# Patient Record
Sex: Female | Born: 1975 | Race: Black or African American | Hispanic: No | Marital: Single | Smoking: Current some day smoker
Health system: Southern US, Community
[De-identification: ages and names within clinical notes are randomized; demographics above are authoritative.]

## PROBLEM LIST (undated history)

## (undated) DIAGNOSIS — R51 Headache: Secondary | ICD-10-CM

## (undated) DIAGNOSIS — F4310 Post-traumatic stress disorder, unspecified: Secondary | ICD-10-CM

## (undated) DIAGNOSIS — E559 Vitamin D deficiency, unspecified: Secondary | ICD-10-CM

## (undated) DIAGNOSIS — R519 Headache, unspecified: Secondary | ICD-10-CM

## (undated) DIAGNOSIS — I10 Essential (primary) hypertension: Secondary | ICD-10-CM

## (undated) HISTORY — DX: Headache, unspecified: R51.9

## (undated) HISTORY — DX: Headache: R51

## (undated) HISTORY — PX: ANKLE SURGERY: SHX546

## (undated) HISTORY — PX: MULTIPLE TOOTH EXTRACTIONS: SHX2053

## (undated) HISTORY — DX: Post-traumatic stress disorder, unspecified: F43.10

## (undated) HISTORY — DX: Vitamin D deficiency, unspecified: E55.9

---

## 2007-04-13 ENCOUNTER — Emergency Department (HOSPITAL_COMMUNITY): Admission: EM | Admit: 2007-04-13 | Discharge: 2007-04-13 | Payer: Self-pay | Admitting: Emergency Medicine

## 2007-05-06 ENCOUNTER — Ambulatory Visit (HOSPITAL_COMMUNITY): Admission: RE | Admit: 2007-05-06 | Discharge: 2007-05-06 | Payer: Self-pay | Admitting: Internal Medicine

## 2008-05-26 ENCOUNTER — Emergency Department (HOSPITAL_COMMUNITY): Admission: EM | Admit: 2008-05-26 | Discharge: 2008-05-26 | Payer: Self-pay | Admitting: Emergency Medicine

## 2010-06-05 LAB — POCT URINALYSIS DIP (DEVICE)
Bilirubin Urine: NEGATIVE
Glucose, UA: NEGATIVE mg/dL
Nitrite: NEGATIVE

## 2010-06-05 LAB — URINE CULTURE: Colony Count: >=100000

## 2015-05-03 DIAGNOSIS — H539 Unspecified visual disturbance: Secondary | ICD-10-CM | POA: Insufficient documentation

## 2015-05-03 DIAGNOSIS — G43909 Migraine, unspecified, not intractable, without status migrainosus: Secondary | ICD-10-CM | POA: Insufficient documentation

## 2015-05-03 DIAGNOSIS — G5621 Lesion of ulnar nerve, right upper limb: Secondary | ICD-10-CM | POA: Insufficient documentation

## 2015-05-03 DIAGNOSIS — R739 Hyperglycemia, unspecified: Secondary | ICD-10-CM | POA: Insufficient documentation

## 2015-07-19 DIAGNOSIS — F324 Major depressive disorder, single episode, in partial remission: Secondary | ICD-10-CM | POA: Insufficient documentation

## 2015-07-19 DIAGNOSIS — F331 Major depressive disorder, recurrent, moderate: Secondary | ICD-10-CM | POA: Insufficient documentation

## 2015-10-19 DIAGNOSIS — R109 Unspecified abdominal pain: Secondary | ICD-10-CM | POA: Insufficient documentation

## 2016-03-27 DIAGNOSIS — E669 Obesity, unspecified: Secondary | ICD-10-CM | POA: Insufficient documentation

## 2018-03-12 ENCOUNTER — Emergency Department (HOSPITAL_COMMUNITY): Payer: No Typology Code available for payment source

## 2018-03-12 ENCOUNTER — Encounter (HOSPITAL_COMMUNITY): Payer: Self-pay

## 2018-03-12 ENCOUNTER — Emergency Department (HOSPITAL_COMMUNITY)
Admission: EM | Admit: 2018-03-12 | Discharge: 2018-03-12 | Disposition: A | Discharge status: Home / Self Care | Payer: No Typology Code available for payment source | Attending: Emergency Medicine | Admitting: Emergency Medicine

## 2018-03-12 DIAGNOSIS — S199XXA Unspecified injury of neck, initial encounter: Secondary | ICD-10-CM | POA: Diagnosis present

## 2018-03-12 DIAGNOSIS — Y92410 Unspecified street and highway as the place of occurrence of the external cause: Secondary | ICD-10-CM | POA: Diagnosis not present

## 2018-03-12 DIAGNOSIS — Y9389 Activity, other specified: Secondary | ICD-10-CM | POA: Insufficient documentation

## 2018-03-12 DIAGNOSIS — S161XXA Strain of muscle, fascia and tendon at neck level, initial encounter: Secondary | ICD-10-CM

## 2018-03-12 DIAGNOSIS — R51 Headache: Secondary | ICD-10-CM | POA: Insufficient documentation

## 2018-03-12 DIAGNOSIS — I10 Essential (primary) hypertension: Secondary | ICD-10-CM | POA: Diagnosis not present

## 2018-03-12 DIAGNOSIS — Y999 Unspecified external cause status: Secondary | ICD-10-CM | POA: Diagnosis not present

## 2018-03-12 HISTORY — DX: Essential (primary) hypertension: I10

## 2018-03-12 MED ORDER — ACETAMINOPHEN 325 MG PO TABS
650.0000 mg | ORAL_TABLET | Freq: Once | ORAL | Status: AC
  Administered 2018-03-12: 650 mg via ORAL
  Filled 2018-03-12: qty 2

## 2018-03-12 MED ORDER — METHOCARBAMOL 500 MG PO TABS: 500.0000 mg | ORAL_TABLET | Freq: Two times a day (BID) | ORAL | 0 refills | Status: DC

## 2018-03-12 MED ORDER — ACETAMINOPHEN 325 MG PO TABS: 650.0000 mg | ORAL_TABLET | Freq: Four times a day (QID) | ORAL | 0 refills | Status: DC | PRN
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
## 2018-03-24 ENCOUNTER — Ambulatory Visit (INDEPENDENT_AMBULATORY_CARE_PROVIDER_SITE_OTHER): Payer: Self-pay | Admitting: Family Medicine

## 2018-03-24 ENCOUNTER — Encounter: Payer: Self-pay | Admitting: Family Medicine

## 2018-03-24 DIAGNOSIS — R319 Hematuria, unspecified: Secondary | ICD-10-CM

## 2018-03-24 DIAGNOSIS — Z0000 Encounter for general adult medical examination without abnormal findings: Secondary | ICD-10-CM

## 2018-03-24 DIAGNOSIS — R8290 Unspecified abnormal findings in urine: Secondary | ICD-10-CM

## 2018-03-24 DIAGNOSIS — N390 Urinary tract infection, site not specified: Secondary | ICD-10-CM | POA: Insufficient documentation

## 2018-03-24 DIAGNOSIS — Z09 Encounter for follow-up examination after completed treatment for conditions other than malignant neoplasm: Secondary | ICD-10-CM

## 2018-03-24 DIAGNOSIS — Z131 Encounter for screening for diabetes mellitus: Secondary | ICD-10-CM

## 2018-03-24 DIAGNOSIS — F419 Anxiety disorder, unspecified: Secondary | ICD-10-CM

## 2018-03-24 DIAGNOSIS — F4310 Post-traumatic stress disorder, unspecified: Secondary | ICD-10-CM

## 2018-03-24 DIAGNOSIS — I10 Essential (primary) hypertension: Secondary | ICD-10-CM | POA: Insufficient documentation

## 2018-03-24 LAB — POCT URINALYSIS DIP (MANUAL ENTRY)
Bilirubin, UA: NEGATIVE
Glucose, UA: NEGATIVE mg/dL
Ketones, POC UA: NEGATIVE mg/dL
Nitrite, UA: NEGATIVE
Spec Grav, UA: 1.025 (ref 1.010–1.025)
Urobilinogen, UA: 1 E.U./dL
pH, UA: 6 (ref 5.0–8.0)

## 2018-03-24 LAB — POCT GLYCOSYLATED HEMOGLOBIN (HGB A1C): Hemoglobin A1C: 5.4 % (ref 4.0–5.6)

## 2018-03-24 MED ORDER — SULFAMETHOXAZOLE-TRIMETHOPRIM 800-160 MG PO TABS: 1.0000 | ORAL_TABLET | Freq: Two times a day (BID) | ORAL | 0 refills | Status: DC

## 2018-03-24 NOTE — Patient Instructions (Addendum)
Sulfamethoxazole; Trimethoprim, SMX-TMP tablets What is this medicine? SULFAMETHOXAZOLE; TRIMETHOPRIM or SMX-TMP (suhl fuh meth OK suh zohl; trye METH oh prim) is a combination of a sulfonamide antibiotic and a second antibiotic, trimethoprim. It is used to treat or prevent certain kinds of bacterial infections. It will not work for colds, flu, or other viral infections. This medicine may be used for other purposes; ask your health care provider or pharmacist if you have questions. COMMON BRAND NAME(S): Bacter-Aid DS, Bactrim, Bactrim DS, Septra, Septra DS What should I tell my health care provider before I take this medicine? They need to know if you have any of these conditions: -anemia -asthma -being treated with anticonvulsants -if you frequently drink alcohol containing drinks -kidney disease -liver disease -low level of folic acid or glucose-6-phosphate dehydrogenase -poor nutrition or malabsorption -porphyria -severe allergies -thyroid disorder -an unusual or allergic reaction to sulfamethoxazole, trimethoprim, sulfa drugs, other medicines, foods, dyes, or preservatives -pregnant or trying to get pregnant -breast-feeding How should I use this medicine? Take this medicine by mouth with a full glass of water. Follow the directions on the prescription label. Take your medicine at regular intervals. Do not take it more often than directed. Do not skip doses or stop your medicine early. Talk to your pediatrician regarding the use of this medicine in children. Special care may be needed. This medicine has been used in children as young as 2 months of age. Overdosage: If you think you have taken too much of this medicine contact a poison control center or emergency room at once. NOTE: This medicine is only for you. Do not share this medicine with others. What if I miss a dose? If you miss a dose, take it as soon as you can. If it is almost time for your next dose, take only that dose. Do  not take double or extra doses. What may interact with this medicine? Do not take this medicine with any of the following medications: -aminobenzoate potassium -dofetilide -metronidazole This medicine may also interact with the following medications: -ACE inhibitors like benazepril, enalapril, lisinopril, and ramipril -birth control pills -cyclosporine -digoxin -diuretics -indomethacin -medicines for diabetes -methenamine -methotrexate -phenytoin -potassium supplements -pyrimethamine -sulfinpyrazone -tricyclic antidepressants -warfarin This list may not describe all possible interactions. Give your health care provider a list of all the medicines, herbs, non-prescription drugs, or dietary supplements you use. Also tell them if you smoke, drink alcohol, or use illegal drugs. Some items may interact with your medicine. What should I watch for while using this medicine? Tell your doctor or health care professional if your symptoms do not improve. Drink several glasses of water a day to reduce the risk of kidney problems. Do not treat diarrhea with over the counter products. Contact your doctor if you have diarrhea that lasts more than 2 days or if it is severe and watery. This medicine can make you more sensitive to the sun. Keep out of the sun. If you cannot avoid being in the sun, wear protective clothing and use a sunscreen. Do not use sun lamps or tanning beds/booths. What side effects may I notice from receiving this medicine? Side effects that you should report to your doctor or health care professional as soon as possible: -allergic reactions like skin rash or hives, swelling of the face, lips, or tongue -breathing problems -fever or chills, sore throat -irregular heartbeat, chest pain -joint or muscle pain -pain or difficulty passing urine -red pinpoint spots on skin -redness, blistering, peeling or loosening of   the skin, including inside the mouth -unusual bleeding or  bruising -unusually weak or tired -yellowing of the eyes or skin Side effects that usually do not require medical attention (report to your doctor or health care professional if they continue or are bothersome): -diarrhea -dizziness -headache -loss of appetite -nausea, vomiting -nervousness This list may not describe all possible side effects. Call your doctor for medical advice about side effects. You may report side effects to FDA at 1-800-FDA-1088. Where should I keep my medicine? Keep out of the reach of children. Store at room temperature between 20 to 25 degrees C (68 to 77 degrees F). Protect from light. Throw away any unused medicine after the expiration date. NOTE: This sheet is a summary. It may not cover all possible information. If you have questions about this medicine, talk to your doctor, pharmacist, or health care provider.  2019 Elsevier/Gold Standard (2012-09-17 14:38:26)       Urinary Tract Infection, Adult A urinary tract infection (UTI) is an infection of any part of the urinary tract. The urinary tract includes:  The kidneys.  The ureters.  The bladder.  The urethra. These organs make, store, and get rid of pee (urine) in the body. What are the causes? This is caused by germs (bacteria) in your genital area. These germs grow and cause swelling (inflammation) of your urinary tract. What increases the risk? You are more likely to develop this condition if:  You have a small, thin tube (catheter) to drain pee.  You cannot control when you pee or poop (incontinence).  You are female, and: ? You use these methods to prevent pregnancy: ? A medicine that kills sperm (spermicide). ? A device that blocks sperm (diaphragm). ? You have low levels of a female hormone (estrogen). ? You are pregnant.  You have genes that add to your risk.  You are sexually active.  You take antibiotic medicines.  You have trouble peeing because of: ? A prostate that is  bigger than normal, if you are female. ? A blockage in the part of your body that drains pee from the bladder (urethra). ? A kidney stone. ? A nerve condition that affects your bladder (neurogenic bladder). ? Not getting enough to drink. ? Not peeing often enough.  You have other conditions, such as: ? Diabetes. ? A weak disease-fighting system (immune system). ? Sickle cell disease. ? Gout. ? Injury of the spine. What are the signs or symptoms? Symptoms of this condition include:  Needing to pee right away (urgently).  Peeing often.  Peeing small amounts often.  Pain or burning when peeing.  Blood in the pee.  Pee that smells bad or not like normal.  Trouble peeing.  Pee that is cloudy.  Fluid coming from the vagina, if you are female.  Pain in the belly or lower back. Other symptoms include:  Throwing up (vomiting).  No urge to eat.  Feeling mixed up (confused).  Being tired and grouchy (irritable).  A fever.  Watery poop (diarrhea). How is this treated? This condition may be treated with:  Antibiotic medicine.  Other medicines.  Drinking enough water. Follow these instructions at home:  Medicines  Take over-the-counter and prescription medicines only as told by your doctor.  If you were prescribed an antibiotic medicine, take it as told by your doctor. Do not stop taking it even if you start to feel better. General instructions  Make sure you: ? Pee until your bladder is empty. ? Do not  hold pee for a long time. ? Empty your bladder after sex. ? Wipe from front to back after pooping if you are a female. Use each tissue one time when you wipe.  Drink enough fluid to keep your pee pale yellow.  Keep all follow-up visits as told by your doctor. This is important. Contact a doctor if:  You do not get better after 1-2 days.  Your symptoms go away and then come back. Get help right away if:  You have very bad back pain.  You have very bad  pain in your lower belly.  You have a fever.  You are sick to your stomach (nauseous).  You are throwing up. Summary  A urinary tract infection (UTI) is an infection of any part of the urinary tract.  This condition is caused by germs in your genital area.  There are many risk factors for a UTI. These include having a small, thin tube to drain pee and not being able to control when you pee or poop.  Treatment includes antibiotic medicines for germs.  Drink enough fluid to keep your pee pale yellow. This information is not intended to replace advice given to you by your health care provider. Make sure you discuss any questions you have with your health care provider. Document Released: 07/30/2007 Document Revised: 08/20/2017 Document Reviewed: 08/20/2017 Elsevier Interactive Patient Education  2019 [employer] Inc.  Muscle Strain A muscle strain is an injury that happens when a muscle is stretched longer than normal. This can happen during a fall, sports, or lifting. This can tear some muscle fibers. Usually, recovery from muscle strain takes 1-2 weeks. Complete healing normally takes 5-6 weeks. This condition is first treated with PRICE therapy. This involves:  Protecting your muscle from being injured again.  Resting your injured muscle.  Icing your injured muscle.  Applying pressure (compression) to your injured muscle. This may be done with a splint or elastic bandage.  Raising (elevating) your injured muscle. Your doctor may also recommend medicine for pain. Follow these instructions at home: If you have a splint:  Wear the splint as told by your doctor. Take it off only as told by your doctor.  Loosen the splint if your fingers or toes tingle, get numb, or turn cold and blue.  Keep the splint clean.  If the splint is not waterproof: ? Do not let it get wet. ? Cover it with a watertight covering when you take a bath or a shower. Managing pain, stiffness, and  swelling   If directed, put ice on your injured area. ? If you have a removable splint, take it off as told by your doctor. ? Put ice in a plastic bag. ? Place a towel between your skin and the bag. ? Leave the ice on for 20 minutes, 2-3 times a day.  Move your fingers or toes often. This helps to avoid stiffness and lessen swelling.  Raise your injured area above the level of your heart while you are sitting or lying down.  Wear an elastic bandage as told by your doctor. Make sure it is not too tight. General instructions  Take over-the-counter and prescription medicines only as told by your doctor.  Limit your activity. Rest your injured muscle as told by your doctor. Your doctor may say that gentle movements are okay.  If physical therapy was prescribed, do exercises as told by your doctor.  Do not put pressure on any part of the splint until it is  fully hardened. This may take many hours.  Do not use any products that contain nicotine or tobacco, such as cigarettes and e-cigarettes. These can delay bone healing. If you need help quitting, ask your doctor.  Warm up before you exercise. This helps to prevent more muscle strains.  Ask your doctor when it is safe to drive if you have a splint.  Keep all follow-up visits as told by your doctor. This is important. Contact a doctor if:  You have more pain or swelling in your injured area. Get help right away if:  You have any of these problems in your injured area: ? You have numbness. ? You have tingling. ? You lose a lot of strength. Summary  A muscle strain is an injury that happens when a muscle is stretched longer than normal.  This condition is first treated with PRICE therapy. This includes protecting, resting, icing, adding pressure, and raising your injury.  Limit your activity. Rest your injured muscle as told by your doctor. Your doctor may say that gentle movements are okay.  Warm up before you exercise. This  helps to prevent more muscle strains. This information is not intended to replace advice given to you by your health care provider. Make sure you discuss any questions you have with your health care provider. Document Released: 11/20/2007 Document Revised: 03/19/2016 Document Reviewed: 03/19/2016 Elsevier Interactive Patient Education  2019 Elsevier Inc.     Living With Post-Traumatic Stress Disorder If you have been diagnosed with post-traumatic stress disorder (PTSD), you may be relieved that you now know why you have felt or behaved a certain way. Still, you may feel overwhelmed about the treatment ahead. You may also wonder how to get the support you need and how to deal with the condition day-to-day. If you are living with PTSD, there are ways to help you recover from it and manage your symptoms. How to manage lifestyle changes Managing stress Stress is your body's reaction to life changes and events, both good and bad. Stress can make PTSD worse. Take the following steps to cope with stress:  Talk with your health care provider or a counselor if you would like to learn more about techniques to reduce your stress. He or she may suggest some stress reduction techniques such as: ? Muscle relaxation exercises. ? Regular exercise. ? Meditation, yoga, or other mind-body exercises. ? Breathing exercises. ? Listening to quiet music. ? Spending time outside.  Maintain a healthy lifestyle. Eat a healthy diet, exercise regularly, get plenty of sleep, and take time to relax.  Spend time with others. Talk with them about how you are feeling and what kind of support you need. Try to not isolate yourself, even though you may feel like doing that. Isolating yourself can delay your recovery.  Do activities and hobbies that you enjoy.  Pace yourself when doing stressful things. Take breaks, and reward yourself when you finish. Make sure that you do not overload your schedule.  Medicines Your  health care provider may suggest certain medicines if he or she feels that they will help to improve your condition. Antidepressants or antipsychotic medicines may be used to treat PTSD. Avoid using alcohol and other substances that may prevent your medicines from working properly (may interact). It is also important to:  Talk with your pharmacist or health care provider about all medicines that you take, their possible side effects, and which medicines are safe to take together.  Make it your goal to take  part in all treatment decisions (shared decision-making). Ask about possible side effects of medicines that your health care provider recommends, and tell him or her how you feel about having those side effects. It is best if shared decision-making with your health care provider is part of your total treatment plan. If your health care provider prescribes a medicine, you may not notice the full benefits of it for 4-8 weeks. Most people who are treated for PTSD need to take medicine for at least 6-12 months after they feel better. If you are taking medicines as part of your treatment, do not stop taking medicines before you ask your health care provider if it is safe to stop. You may need to have the medicine slowly decreased (tapered) over time to lower the risk of harmful side effects. Relationships Many people who have PTSD have difficulty trusting others. Make an effort to:  Take risks and develop trust with close friends and family members. Developing trust in others can help you feel safe and connect you with emotional support.  Be open and honest about your feelings.  Try to have fun and relax in safe spaces, such as with friends and family.  Think about going to couples counseling, family education classes, or family therapy. Your loved ones may not always know how to be supportive. Therapy can be helpful for everyone. How to recognize changes in your condition Be aware of your symptoms and  how often you have them. The following symptoms mean that you need to seek help for your PTSD:  You feel suspicious and angry.  You have repeated flashbacks.  You avoid going out or being with others.  You have an increasing number of fights with close friends or family members, such as your spouse.  You have thoughts about hurting yourself or others.  You cannot get relief from feelings of depression or anxiety. Where to find support Talking to others  Explain that PTSD is a mental health problem. It is something that a person can develop after experiencing or seeing a life-threatening event. Tell them that PTSD makes you feel stress like you did during the event.  Talk to your loved ones about the symptoms you have. Also tell them what things or situations can cause symptoms to start (are triggers for you).  Assure your loved ones that there are treatments to help PTSD. Discuss possibly seeking family therapy or couples therapy.  If you are worried or fearful about seeking treatment, ask for support. Finances Not all insurance plans cover mental health care, so it is important to check with your insurance carrier. If paying for co-pays or counseling services is a problem, search for a local or county mental health care center. Public mental health care services may be offered there at a low cost or no cost when you are not able to see a private health care provider. If you are a veteran, contact a local veterans organization or veterans hospital for more information. If you are taking medicine for PTSD, you may be able to get the genericform, which may be less expensive than brand-name medicine. Some makers of prescription medicines also offer help to patients who cannot afford the medicines that they need. Community resources  Find a support group in your community. Often, groups are available for [employer] veterans, trauma victims, and family members or caregivers.  Look into  volunteer opportunities. Taking part in these can help you feel more connected to your community.  Contact a local  organization to find out if you are eligible for a service dog.  Keep daily contact with at least one trusted friend or family member. Follow these instructions at home: Lifestyle  Exercise regularly. Try to do 30 or more minutes of physical activity on most days of the week.  Try to get 7-9 hours of sleep each night. To help with sleep: ? Keep your bedroom cool and dark. ? Do not eat a heavy meal during the hour before you go to bed. ? Do not drink alcohol or caffeinated drinks before bed. ? Avoid screen time before bedtime. This means avoiding use of your TV, computer, tablet, and cell phone.  Avoid using alcohol or drugs.  Practice self-soothing skills and use them daily.  Try to have fun and seek humor in your life. General instructions  If your PTSD is affecting your marriage or family, seek help from a family therapist.  Take over-the-counter and prescription medicines only as told by your health care provider.  Make sure to let all of your health care providers know that you have PTSD. This is especially important if you are having surgery or need to be admitted to the hospital.  Keep all follow-up visits as told by your health care providers. This is important. Where to find more information Go to this website to find more information about PTSD, treatment for PTSD, and how to get support:  [facility]: www.ptsd.[employer].tnva.gov Contact a health care provider if:  Your symptoms get worse or they do not get better. Get help right away if:  You have thoughts about hurting yourself or others. If you ever feel like you may hurt yourself or others, or have thoughts about taking your own life, get help right away. You can go to your nearest emergency department or call:  Your local emergency services (911 in the [place]  A suicide crisis helpline, such as  the National Suicide Prevention Lifeline at 364-039-93411-832 196 7837. This is open 24-hours a day. Summary  If you are living with PTSD, there are ways to help you recover from it and manage your symptoms.  Find supportive environments and people who understand PTSD. Spend time in those places, and maintain contact with those people.  Work with your health care team to create a management plan that includes counseling, stress management techniques, and healthy lifestyle habits. This information is not intended to replace advice given to you by your health care provider. Make sure you discuss any questions you have with your health care provider. Document Released: 06/12/2016 Document Revised: 06/12/2016 Document Reviewed: 06/12/2016 Elsevier Interactive Patient Education  2019 [employer]
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
## 2018-03-25 ENCOUNTER — Other Ambulatory Visit: Payer: Self-pay | Admitting: Family Medicine

## 2018-03-25 DIAGNOSIS — E559 Vitamin D deficiency, unspecified: Secondary | ICD-10-CM

## 2018-03-25 MED ORDER — VITAMIN D (ERGOCALCIFEROL) 1.25 MG (50000 UNIT) PO CAPS: 50000.0000 [IU] | ORAL_CAPSULE | ORAL | 3 refills | Status: DC

## 2018-03-26 ENCOUNTER — Telehealth: Payer: Self-pay

## 2018-03-26 LAB — COMPREHENSIVE METABOLIC PANEL
ALT: 14 IU/L (ref 0–32)
AST: 15 IU/L (ref 0–40)
Albumin/Globulin Ratio: 1.8 (ref 1.2–2.2)
Albumin: 4.2 g/dL (ref 3.8–4.8)
Alkaline Phosphatase: 58 IU/L (ref 39–117)
BUN/Creatinine Ratio: 8 — ABNORMAL LOW (ref 9–23)
BUN: 9 mg/dL (ref 6–24)
Bilirubin Total: 0.4 mg/dL (ref 0.0–1.2)
CO2: 24 mmol/L (ref 20–29)
Calcium: 9.8 mg/dL (ref 8.7–10.2)
Chloride: 105 mmol/L (ref 96–106)
Creatinine, Ser: 1.07 mg/dL — ABNORMAL HIGH (ref 0.57–1.00)
GFR calc Af Amer: 73 mL/min/{1.73_m2} (ref >59)
GFR calc non Af Amer: 63 mL/min/{1.73_m2} (ref >59)
Globulin, Total: 2.4 g/dL (ref 1.5–4.5)
Glucose: 83 mg/dL (ref 65–99)
Potassium: 4.7 mmol/L (ref 3.5–5.2)
Sodium: 140 mmol/L (ref 134–144)
Total Protein: 6.6 g/dL (ref 6.0–8.5)

## 2018-03-26 LAB — LIPID PANEL
Chol/HDL Ratio: 2.1 ratio (ref 0.0–4.4)
Cholesterol, Total: 125 mg/dL (ref 100–199)
HDL: 60 mg/dL (ref >39)
LDL Calculated: 49 mg/dL (ref 0–99)
Triglycerides: 80 mg/dL (ref 0–149)
VLDL Cholesterol Cal: 16 mg/dL (ref 5–40)

## 2018-03-26 LAB — CBC WITH DIFFERENTIAL/PLATELET
Basophils Absolute: 0 10*3/uL (ref 0.0–0.2)
Basos: 1 %
EOS (ABSOLUTE): 0 10*3/uL (ref 0.0–0.4)
Eos: 1 %
Hematocrit: 41.5 % (ref 34.0–46.6)
Hemoglobin: 13.8 g/dL (ref 11.1–15.9)
Immature Grans (Abs): 0 10*3/uL (ref 0.0–0.1)
Immature Granulocytes: 0 %
Lymphocytes Absolute: 1.9 10*3/uL (ref 0.7–3.1)
Lymphs: 35 %
MCH: 29.4 pg (ref 26.6–33.0)
MCHC: 33.3 g/dL (ref 31.5–35.7)
MCV: 89 fL (ref 79–97)
Monocytes Absolute: 0.4 10*3/uL (ref 0.1–0.9)
Monocytes: 7 %
Neutrophils Absolute: 3.1 10*3/uL (ref 1.4–7.0)
Neutrophils: 56 %
Platelets: 298 10*3/uL (ref 150–450)
RBC: 4.69 x10E6/uL (ref 3.77–5.28)
RDW: 13.3 % (ref 11.7–15.4)
WBC: 5.5 10*3/uL (ref 3.4–10.8)

## 2018-03-26 LAB — TSH: TSH: 2.61 u[IU]/mL (ref 0.450–4.500)

## 2018-03-26 LAB — HEPB+HEPC+HIV PANEL
HIV Screen 4th Generation wRfx: NONREACTIVE
Hep B C IgM: NEGATIVE
Hep B Core Total Ab: NEGATIVE
Hep B E Ab: NEGATIVE
Hep B E Ag: NEGATIVE
Hep B Surface Ab, Qual: NONREACTIVE
Hep C Virus Ab: <0.1 s/co ratio (ref 0.0–0.9)
Hepatitis B Surface Ag: NEGATIVE

## 2018-03-26 LAB — URINE CULTURE

## 2018-03-26 LAB — VITAMIN D 25 HYDROXY (VIT D DEFICIENCY, FRACTURES): Vit D, 25-Hydroxy: 8.2 ng/mL — ABNORMAL LOW (ref 30.0–100.0)

## 2018-03-26 NOTE — Telephone Encounter (Signed)
-----   Message from Natalie M Stroud, FNP sent at 03/25/2018  9:04 PM EST ----- Regarding: "Vitamin D" Carrie,   Please inform patient that he has a low Vitamin D level. We recommend that he takes a weekly Vitamin D supplement of 50,000 IUs weekly to increase vitamin d levels. Rx sent to pharmacy today.    Hep results are still pending.   Thank you.           

## 2018-03-26 NOTE — Telephone Encounter (Signed)
Patient notified

## 2018-03-26 NOTE — Telephone Encounter (Signed)
Left a vm for patient to callback 

## 2018-03-26 NOTE — Telephone Encounter (Signed)
-----   Message from Kallie Locks, FNP sent at 03/25/2018  9:04 PM EST ----- Regarding: "Vitamin D" Melanie Shaw,   Please inform patient that he has a low Vitamin D level. We recommend that he takes a weekly Vitamin D supplement of 50,000 IUs weekly to increase vitamin d levels. Rx sent to pharmacy today.    Hep results are still pending.   Thank you.

## 2018-03-30 ENCOUNTER — Other Ambulatory Visit: Payer: Self-pay | Admitting: Family Medicine

## 2018-04-13 ENCOUNTER — Ambulatory Visit (HOSPITAL_COMMUNITY): Payer: Self-pay | Admitting: Psychiatry

## 2018-05-24 ENCOUNTER — Ambulatory Visit: Payer: Self-pay | Admitting: Family Medicine

## 2018-06-14 ENCOUNTER — Ambulatory Visit: Payer: Self-pay | Admitting: Family Medicine

## 2018-07-02 ENCOUNTER — Ambulatory Visit (INDEPENDENT_AMBULATORY_CARE_PROVIDER_SITE_OTHER): Payer: Commercial Managed Care - PPO | Admitting: Family Medicine

## 2018-07-02 ENCOUNTER — Other Ambulatory Visit: Payer: Self-pay

## 2018-07-02 ENCOUNTER — Encounter: Payer: Self-pay | Admitting: Family Medicine

## 2018-07-02 VITALS — BP 130/86 | HR 84 | Temp 98.0°F | Ht 60.0 in | Wt 172.8 lb

## 2018-07-02 DIAGNOSIS — Z1272 Encounter for screening for malignant neoplasm of vagina: Secondary | ICD-10-CM | POA: Diagnosis not present

## 2018-07-02 DIAGNOSIS — N390 Urinary tract infection, site not specified: Secondary | ICD-10-CM | POA: Diagnosis not present

## 2018-07-02 DIAGNOSIS — E559 Vitamin D deficiency, unspecified: Secondary | ICD-10-CM

## 2018-07-02 DIAGNOSIS — Z09 Encounter for follow-up examination after completed treatment for conditions other than malignant neoplasm: Secondary | ICD-10-CM

## 2018-07-02 DIAGNOSIS — R8290 Unspecified abnormal findings in urine: Secondary | ICD-10-CM | POA: Diagnosis not present

## 2018-07-02 LAB — POCT URINALYSIS DIP (MANUAL ENTRY)
Bilirubin, UA: NEGATIVE
Glucose, UA: NEGATIVE mg/dL
Ketones, POC UA: NEGATIVE mg/dL
Nitrite, UA: NEGATIVE
Protein Ur, POC: NEGATIVE mg/dL
Spec Grav, UA: 1.02 (ref 1.010–1.025)
Urobilinogen, UA: 0.2 E.U./dL
pH, UA: 7.5 (ref 5.0–8.0)

## 2018-07-02 MED ORDER — VITAMIN D (ERGOCALCIFEROL) 1.25 MG (50000 UNIT) PO CAPS: 50000.0000 [IU] | ORAL_CAPSULE | ORAL | 3 refills | Status: DC

## 2018-07-02 MED ORDER — SULFAMETHOXAZOLE-TRIMETHOPRIM 800-160 MG PO TABS: 1.0000 | ORAL_TABLET | Freq: Two times a day (BID) | ORAL | 0 refills | Status: DC

## 2018-07-02 NOTE — Patient Instructions (Signed)
Sulfamethoxazole; Trimethoprim, SMX-TMP tablets What is this medicine? SULFAMETHOXAZOLE; TRIMETHOPRIM or SMX-TMP (suhl fuh meth OK suh zohl; trye METH oh prim) is a combination of a sulfonamide antibiotic and a second antibiotic, trimethoprim. It is used to treat or prevent certain kinds of bacterial infections. It will not work for colds, flu, or other viral infections. This medicine may be used for other purposes; ask your health care provider or pharmacist if you have questions. COMMON BRAND NAME(S): Bacter-Aid DS, Bactrim, Bactrim DS, Septra, Septra DS What should I tell my health care provider before I take this medicine? They need to know if you have any of these conditions: -anemia -asthma -being treated with anticonvulsants -if you frequently drink alcohol containing drinks -kidney disease -liver disease -low level of folic acid or glucose-6-phosphate dehydrogenase -poor nutrition or malabsorption -porphyria -severe allergies -thyroid disorder -an unusual or allergic reaction to sulfamethoxazole, trimethoprim, sulfa drugs, other medicines, foods, dyes, or preservatives -pregnant or trying to get pregnant -breast-feeding How should I use this medicine? Take this medicine by mouth with a full glass of water. Follow the directions on the prescription label. Take your medicine at regular intervals. Do not take it more often than directed. Do not skip doses or stop your medicine early. Talk to your pediatrician regarding the use of this medicine in children. Special care may be needed. This medicine has been used in children as young as 2 months of age. Overdosage: If you think you have taken too much of this medicine contact a poison control center or emergency room at once. NOTE: This medicine is only for you. Do not share this medicine with others. What if I miss a dose? If you miss a dose, take it as soon as you can. If it is almost time for your next dose, take only that dose. Do  not take double or extra doses. What may interact with this medicine? Do not take this medicine with any of the following medications: -aminobenzoate potassium -dofetilide -metronidazole This medicine may also interact with the following medications: -ACE inhibitors like benazepril, enalapril, lisinopril, and ramipril -birth control pills -cyclosporine -digoxin -diuretics -indomethacin -medicines for diabetes -methenamine -methotrexate -phenytoin -potassium supplements -pyrimethamine -sulfinpyrazone -tricyclic antidepressants -warfarin This list may not describe all possible interactions. Give your health care provider a list of all the medicines, herbs, non-prescription drugs, or dietary supplements you use. Also tell them if you smoke, drink alcohol, or use illegal drugs. Some items may interact with your medicine. What should I watch for while using this medicine? Tell your doctor or health care professional if your symptoms do not improve. Drink several glasses of water a day to reduce the risk of kidney problems. Do not treat diarrhea with over the counter products. Contact your doctor if you have diarrhea that lasts more than 2 days or if it is severe and watery. This medicine can make you more sensitive to the sun. Keep out of the sun. If you cannot avoid being in the sun, wear protective clothing and use a sunscreen. Do not use sun lamps or tanning beds/booths. What side effects may I notice from receiving this medicine? Side effects that you should report to your doctor or health care professional as soon as possible: -allergic reactions like skin rash or hives, swelling of the face, lips, or tongue -breathing problems -fever or chills, sore throat -irregular heartbeat, chest pain -joint or muscle pain -pain or difficulty passing urine -red pinpoint spots on skin -redness, blistering, peeling or loosening of   the skin, including inside the mouth -unusual bleeding or  bruising -unusually weak or tired -yellowing of the eyes or skin Side effects that usually do not require medical attention (report to your doctor or health care professional if they continue or are bothersome): -diarrhea -dizziness -headache -loss of appetite -nausea, vomiting -nervousness This list may not describe all possible side effects. Call your doctor for medical advice about side effects. You may report side effects to FDA at 1-800-FDA-1088. Where should I keep my medicine? Keep out of the reach of children. Store at room temperature between 20 to 25 degrees C (68 to 77 degrees F). Protect from light. Throw away any unused medicine after the expiration date. NOTE: This sheet is a summary. It may not cover all possible information. If you have questions about this medicine, talk to your doctor, pharmacist, or health care provider.  2019 Elsevier/Gold Standard (2012-09-17 14:38:26) Urinary Tract Infection, Adult A urinary tract infection (UTI) is an infection of any part of the urinary tract. The urinary tract includes:  The kidneys.  The ureters.  The bladder.  The urethra. These organs make, store, and get rid of pee (urine) in the body. What are the causes? This is caused by germs (bacteria) in your genital area. These germs grow and cause swelling (inflammation) of your urinary tract. What increases the risk? You are more likely to develop this condition if:  You have a small, thin tube (catheter) to drain pee.  You cannot control when you pee or poop (incontinence).  You are female, and: ? You use these methods to prevent pregnancy: ? A medicine that kills sperm (spermicide). ? A device that blocks sperm (diaphragm). ? You have low levels of a female hormone (estrogen). ? You are pregnant.  You have genes that add to your risk.  You are sexually active.  You take antibiotic medicines.  You have trouble peeing because of: ? A prostate that is bigger than  normal, if you are female. ? A blockage in the part of your body that drains pee from the bladder (urethra). ? A kidney stone. ? A nerve condition that affects your bladder (neurogenic bladder). ? Not getting enough to drink. ? Not peeing often enough.  You have other conditions, such as: ? Diabetes. ? A weak disease-fighting system (immune system). ? Sickle cell disease. ? Gout. ? Injury of the spine. What are the signs or symptoms? Symptoms of this condition include:  Needing to pee right away (urgently).  Peeing often.  Peeing small amounts often.  Pain or burning when peeing.  Blood in the pee.  Pee that smells bad or not like normal.  Trouble peeing.  Pee that is cloudy.  Fluid coming from the vagina, if you are female.  Pain in the belly or lower back. Other symptoms include:  Throwing up (vomiting).  No urge to eat.  Feeling mixed up (confused).  Being tired and grouchy (irritable).  A fever.  Watery poop (diarrhea). How is this treated? This condition may be treated with:  Antibiotic medicine.  Other medicines.  Drinking enough water. Follow these instructions at home:  Medicines  Take over-the-counter and prescription medicines only as told by your doctor.  If you were prescribed an antibiotic medicine, take it as told by your doctor. Do not stop taking it even if you start to feel better. General instructions  Make sure you: ? Pee until your bladder is empty. ? Do not hold pee for a long time. ?   Empty your bladder after sex. ? Wipe from front to back after pooping if you are a female. Use each tissue one time when you wipe.  Drink enough fluid to keep your pee pale yellow.  Keep all follow-up visits as told by your doctor. This is important. Contact a doctor if:  You do not get better after 1-2 days.  Your symptoms go away and then come back. Get help right away if:  You have very bad back pain.  You have very bad pain in  your lower belly.  You have a fever.  You are sick to your stomach (nauseous).  You are throwing up. Summary  A urinary tract infection (UTI) is an infection of any part of the urinary tract.  This condition is caused by germs in your genital area.  There are many risk factors for a UTI. These include having a small, thin tube to drain pee and not being able to control when you pee or poop.  Treatment includes antibiotic medicines for germs.  Drink enough fluid to keep your pee pale yellow. This information is not intended to replace advice given to you by your health care provider. Make sure you discuss any questions you have with your health care provider. Document Released: 07/30/2007 Document Revised: 08/20/2017 Document Reviewed: 08/20/2017 Elsevier Interactive Patient Education  2019 [employer] Pap Test Why am I having this test? A Pap test, also called a Pap smear, is a screening test to check for signs of:  Cancer of the vagina, cervix, and uterus. The cervix is the lower part of the uterus that opens into the vagina.  Infection.  Changes that may be a sign that cancer is developing (precancerous changes). Women need this test on a regular basis. In general, you should have a Pap test every 3 years until you reach menopause or age 45. Women aged 30-60 may choose to have their Pap test done at the same time as an HPV (human papillomavirus) test every 5 years (instead of every 3 years). Your health care provider may recommend having Pap tests more or less often depending on your medical conditions and past Pap test results. What kind of sample is taken?  Your health care provider will collect a sample of cells from the surface of your cervix. This will be done using a small cotton swab, plastic spatula, or brush. This sample is often collected during a pelvic exam, when you are lying on your back on an exam table with feet in footrests (stirrups). In some cases, fluids  (secretions) from the cervix or vagina may also be collected. How do I prepare for this test?  Be aware of where you are in your menstrual cycle. If you are menstruating on the day of the test, you may be asked to reschedule.  You may need to reschedule if you have a known vaginal infection on the day of the test.  Follow instructions from your health care provider about: ? Changing or stopping your regular medicines. Some medicines can cause abnormal test results, such as digitalis and tetracycline. ? Avoiding douching or taking a bath the day before or the day of the test. Tell a health care provider about:  Any allergies you have.  All medicines you are taking, including vitamins, herbs, eye drops, creams, and over-the-counter medicines.  Any blood disorders you have.  Any surgeries you have had.  Any medical conditions you have.  Whether you are pregnant or may be pregnant. How  are the results reported? Your test results will be reported as either abnormal or normal. A false-positive result can occur. A false positive is incorrect because it means that a condition is present when it is not. A false-negative result can occur. A false negative is incorrect because it means that a condition is not present when it is. What do the results mean? A normal test result means that you do not have signs of cancer of the vagina, cervix, or uterus. An abnormal result may mean that you have:  Cancer. A Pap test by itself is not enough to diagnose cancer. You will have more tests done in this case.  Precancerous changes in your vagina, cervix, or uterus.  Inflammation of the cervix.  An STD (sexually transmitted disease).  A fungal infection.  A parasite infection. Talk with your health care provider about what your results mean. Questions to ask your health care provider Ask your health care provider, or the department that is doing the test:  When will my results be ready?  How  will I get my results?  What are my treatment options?  What other tests do I need?  What are my next steps? Summary  In general, women should have a Pap test every 3 years until they reach menopause or age 70.  Your health care provider will collect a sample of cells from the surface of your cervix. This will be done using a small cotton swab, plastic spatula, or brush.  In some cases, fluids (secretions) from the cervix or vagina may also be collected. This information is not intended to replace advice given to you by your health care provider. Make sure you discuss any questions you have with your health care provider. Document Released: 05/03/2002 Document Revised: 10/20/2016 Document Reviewed: 10/20/2016 Elsevier Interactive Patient Education  2019 [employer].

## 2018-07-02 NOTE — Progress Notes (Signed)
[facility]   Pap Smear  Subjective:  Patient ID: Melanie Shaw, female    DOB: May 01, 1975  Age: 43 y.o. MRN: [id number]  CC:  Chief Complaint  Patient presents with  . Gynecologic Exam    HPI Melanie Shaw is a 43 year old female who presents for Pap Smear today.   Past Medical History:  Diagnosis Date  . Headache   . Hypertension   . PTSD (post-traumatic stress disorder)    Current Status: Patient here for routine gynecological exam.  She has previously had one Pap smear. Patient states that she has never had an abnormal Pap smear. is nullipar. She is sexually active. She denies abnormal vaginal discharge, vaginal  itching, vaginal burning, or dyspareunia. Patient states that she does not perform monthly self breast exams. She does not have any family history of cancer that she knows of.  She typically follows a balanced diet but does not exercise routinely. Body mass index is 33.75.  Gynecologic History Patient's last menstrual period was 06/2018. She states that has recently been having spotting in between menstrual cycles. No discharge, discomfort, irregular bleeding, abdominal pain. Regular periods usually last 5-7 days.   Past Surgical History:  Procedure Laterality Date  . ANKLE SURGERY    . CESAREAN SECTION    . MULTIPLE TOOTH EXTRACTIONS      Family History  Problem Relation Age of Onset  . Hypertension Mother     Social History   Socioeconomic History  . Marital status: Single    Spouse name: Not on file  . Number of children: Not on file  . Years of education: Not on file  . Highest education level: Not on file  Occupational History  . Not on file  Social Needs  . Financial resource strain: Not on file  . Food insecurity:    Worry: Not on file    Inability: Not on file  . Transportation needs:    Medical: Not on file    Non-medical: Not on file  Tobacco Use  . Smoking status: Current Some  Day Smoker    Types: Cigars  . Smokeless tobacco: Never Used  Substance and Sexual Activity  . Alcohol use: Yes    Comment: occasionally   . Drug use: Never  . Sexual activity: Not on file  Lifestyle  . Physical activity:    Days per week: Not on file    Minutes per session: Not on file  . Stress: Not on file  Relationships  . Social connections:    Talks on phone: Not on file    Gets together: Not on file    Attends religious service: Not on file    Active member of club or organization: Not on file    Attends meetings of clubs or organizations: Not on file    Relationship status: Not on file  . Intimate partner violence:    Fear of current or ex partner: Not on file    Emotionally abused: Not on file    Physically abused: Not on file    Forced sexual activity: Not on file  Other Topics Concern  . Not on file  Social History Narrative  . Not on file    Outpatient Medications Prior to Visit  Medication Sig Dispense Refill  . acetaminophen (TYLENOL) 325 MG tablet Take 2 tablets (650 mg total) by mouth every 6 (six) hours as needed. 30 tablet 0  . Vitamin  D, Ergocalciferol, (DRISDOL) 1.25 MG (50000 UT) CAPS capsule Take 1 capsule (50,000 Units total) by mouth every 7 (seven) days. 5 capsule 3  . methocarbamol (ROBAXIN) 500 MG tablet Take 1 tablet (500 mg total) by mouth 2 (two) times daily. (Patient not taking: Reported on 07/02/2018) 20 tablet 0  . sulfamethoxazole-trimethoprim (BACTRIM DS,SEPTRA DS) 800-160 MG tablet Take 1 tablet by mouth 2 (two) times daily. 14 tablet 0   No facility-administered medications prior to visit.     No Known Allergies  ROS Review of Systems  Constitutional: Negative.   HENT: Negative.   Eyes: Negative.   Respiratory: Negative.   Cardiovascular: Negative.   Gastrointestinal: Negative.   Endocrine: Negative.   Genitourinary: Negative.   Musculoskeletal: Negative.   Skin: Negative.   Allergic/Immunologic: Negative.   Neurological:  Positive for dizziness and headaches.  Hematological: Negative.   Psychiatric/Behavioral: Negative.       Objective:    Physical Exam  Constitutional: She is oriented to person, place, and time. She appears well-developed and well-nourished.  HENT:  Head: Normocephalic and atraumatic.  Eyes: Conjunctivae are normal.  Neck: Normal range of motion. Neck supple.  Cardiovascular: Normal rate, regular rhythm, normal heart sounds and intact distal pulses.  Pulmonary/Chest: Effort normal and breath sounds normal.  Abdominal: Soft. Bowel sounds are normal.  Musculoskeletal: Normal range of motion.  Neurological: She is alert and oriented to person, place, and time. She has normal reflexes.  Skin: Skin is warm and dry.  Psychiatric: She has a normal mood and affect. Her behavior is normal. Judgment and thought content normal.  Nursing note and vitals reviewed.   BP 130/86 (BP Location: Left Arm, Patient Position: Sitting, Cuff Size: Small)   Pulse 84   Temp 98 F (36.7 C) (Oral)   Ht 5' (1.524 m)   Wt 172 lb 12.8 oz (78.4 kg)   LMP 06/14/2018   SpO2 100%   BMI 33.75 kg/m  Wt Readings from Last 3 Encounters:  07/02/18 172 lb 12.8 oz (78.4 kg)  03/24/18 169 lb 9.6 oz (76.9 kg)     Health Maintenance Due  Topic Date Due  . PAP SMEAR-Modifier  10/13/1994    There are no preventive care reminders to display for this patient.  Lab Results  Component Value Date   TSH 2.610 03/24/2018   Lab Results  Component Value Date   WBC 5.5 03/24/2018   HGB 13.8 03/24/2018   HCT 41.5 03/24/2018   MCV 89 03/24/2018   PLT 298 03/24/2018   Lab Results  Component Value Date   NA 140 03/24/2018   K 4.7 03/24/2018   CO2 24 03/24/2018   GLUCOSE 83 03/24/2018   BUN 9 03/24/2018   CREATININE 1.07 (H) 03/24/2018   BILITOT 0.4 03/24/2018   ALKPHOS 58 03/24/2018   AST 15 03/24/2018   ALT 14 03/24/2018   PROT 6.6 03/24/2018   ALBUMIN 4.2 03/24/2018   CALCIUM 9.8 03/24/2018   Lab  Results  Component Value Date   CHOL 125 03/24/2018   Lab Results  Component Value Date   HDL 60 03/24/2018   Lab Results  Component Value Date   LDLCALC 49 03/24/2018   Lab Results  Component Value Date   TRIG 80 03/24/2018   Lab Results  Component Value Date   CHOLHDL 2.1 03/24/2018   Lab Results  Component Value Date   HGBA1C 5.4 03/24/2018    Assessment & Plan:   1. Vaginal Pap smear - POCT  urinalysis dipstick - Pap IG w/ reflex to HPV when ASC-U  2. Urinary tract infection without hematuria, site unspecified We will initiate Septra today.  - sulfamethoxazole-trimethoprim (BACTRIM DS) 800-160 MG tablet; Take 1 tablet by mouth 2 (two) times daily.  Dispense: 14 tablet; Refill: 0  3. Abnormal urinalysis Results are pending.  - Urine Culture  4. Vitamin D deficiency - Vitamin D, Ergocalciferol, (DRISDOL) 1.25 MG (50000 UT) CAPS capsule; Take 1 capsule (50,000 Units total) by mouth every 7 (seven) days.  Dispense: 5 capsule; Refill: 3  5. Follow up She will follow up in 6 months.   Meds ordered this encounter  Medications  . Vitamin D, Ergocalciferol, (DRISDOL) 1.25 MG (50000 UT) CAPS capsule    Sig: Take 1 capsule (50,000 Units total) by mouth every 7 (seven) days.    Dispense:  5 capsule    Refill:  3  . sulfamethoxazole-trimethoprim (BACTRIM DS) 800-160 MG tablet    Sig: Take 1 tablet by mouth 2 (two) times daily.    Dispense:  14 tablet    Refill:  0    Orders Placed This Encounter  Procedures  . Urine Culture  . POCT urinalysis dipstick    Referral Orders  No referral(s) requested today    Raliegh Ip,  MSN, FNP-C [facility] [address] [place], [place] [address] [phone]  Problem List Items Addressed This Visit    None    Visit Diagnoses    Vaginal Pap smear    -  Primary   Relevant Orders   POCT urinalysis dipstick (Completed)   Pap IG w/ reflex to HPV when ASC-U   Urinary tract  infection without hematuria, site unspecified       Relevant Medications   sulfamethoxazole-trimethoprim (BACTRIM DS) 800-160 MG tablet   Abnormal urinalysis       Relevant Orders   Urine Culture   Vitamin D deficiency       Relevant Medications   Vitamin D, Ergocalciferol, (DRISDOL) 1.25 MG (50000 UT) CAPS capsule   Follow up          Meds ordered this encounter  Medications  . Vitamin D, Ergocalciferol, (DRISDOL) 1.25 MG (50000 UT) CAPS capsule    Sig: Take 1 capsule (50,000 Units total) by mouth every 7 (seven) days.    Dispense:  5 capsule    Refill:  3  . sulfamethoxazole-trimethoprim (BACTRIM DS) 800-160 MG tablet    Sig: Take 1 tablet by mouth 2 (two) times daily.    Dispense:  14 tablet    Refill:  0    Follow-up: Return in about 6 months (around 01/02/2019).    Kallie Locks, FNP

## 2018-07-04 LAB — URINE CULTURE

## 2018-07-05 LAB — PAP IG W/ RFLX HPV ASCU

## 2018-07-09 ENCOUNTER — Other Ambulatory Visit: Payer: Self-pay | Admitting: Family Medicine

## 2018-07-09 ENCOUNTER — Telehealth: Payer: Self-pay

## 2018-07-09 DIAGNOSIS — A599 Trichomoniasis, unspecified: Secondary | ICD-10-CM

## 2018-07-09 MED ORDER — METRONIDAZOLE 500 MG PO TABS: 2000.0000 mg | ORAL_TABLET | Freq: Once | ORAL | 0 refills | Status: AC

## 2018-07-09 NOTE — Telephone Encounter (Signed)
-----   Message from Kallie Locks, FNP sent at 07/09/2018 10:56 AM EDT ----- Regarding: "Positive for Trich" Thank you for reviewing Pap results. Rx for Flagyl sent to pharmacy today. Please inform patient.

## 2018-07-09 NOTE — Telephone Encounter (Signed)
Patient notified of results.

## 2018-12-26 DIAGNOSIS — R200 Anesthesia of skin: Secondary | ICD-10-CM

## 2018-12-26 DIAGNOSIS — R202 Paresthesia of skin: Secondary | ICD-10-CM

## 2018-12-26 HISTORY — DX: Anesthesia of skin: R20.0

## 2018-12-26 HISTORY — DX: Anesthesia of skin: R20.2

## 2019-01-07 ENCOUNTER — Ambulatory Visit (INDEPENDENT_AMBULATORY_CARE_PROVIDER_SITE_OTHER): Payer: Commercial Managed Care - PPO | Admitting: Family Medicine

## 2019-01-07 ENCOUNTER — Other Ambulatory Visit: Payer: Self-pay

## 2019-01-07 ENCOUNTER — Encounter: Payer: Self-pay | Admitting: Family Medicine

## 2019-01-07 VITALS — BP 140/82 | HR 79 | Temp 98.1°F | Ht 60.0 in | Wt 185.8 lb

## 2019-01-07 DIAGNOSIS — R200 Anesthesia of skin: Secondary | ICD-10-CM

## 2019-01-07 DIAGNOSIS — R202 Paresthesia of skin: Secondary | ICD-10-CM

## 2019-01-07 DIAGNOSIS — Z87828 Personal history of other (healed) physical injury and trauma: Secondary | ICD-10-CM

## 2019-01-07 DIAGNOSIS — I10 Essential (primary) hypertension: Secondary | ICD-10-CM

## 2019-01-07 DIAGNOSIS — E559 Vitamin D deficiency, unspecified: Secondary | ICD-10-CM

## 2019-01-07 DIAGNOSIS — M79621 Pain in right upper arm: Secondary | ICD-10-CM

## 2019-01-07 DIAGNOSIS — Z1239 Encounter for other screening for malignant neoplasm of breast: Secondary | ICD-10-CM

## 2019-01-07 DIAGNOSIS — Z09 Encounter for follow-up examination after completed treatment for conditions other than malignant neoplasm: Secondary | ICD-10-CM

## 2019-01-07 LAB — POCT URINALYSIS DIPSTICK
Bilirubin, UA: NEGATIVE
Glucose, UA: NEGATIVE
Ketones, UA: NEGATIVE
Leukocytes, UA: NEGATIVE
Nitrite, UA: NEGATIVE
Protein, UA: NEGATIVE
Spec Grav, UA: >=1.03 — AB (ref 1.010–1.025)
Urobilinogen, UA: 0.2 E.U./dL
pH, UA: 5.5 (ref 5.0–8.0)

## 2019-01-07 MED ORDER — VITAMIN D (ERGOCALCIFEROL) 1.25 MG (50000 UNIT) PO CAPS: 50000.0000 [IU] | ORAL_CAPSULE | ORAL | 3 refills | Status: DC
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
## 2019-01-08 ENCOUNTER — Encounter: Payer: Self-pay | Admitting: Family Medicine

## 2019-01-08 DIAGNOSIS — E559 Vitamin D deficiency, unspecified: Secondary | ICD-10-CM | POA: Insufficient documentation

## 2019-01-08 DIAGNOSIS — M79621 Pain in right upper arm: Secondary | ICD-10-CM | POA: Insufficient documentation

## 2019-01-08 DIAGNOSIS — R202 Paresthesia of skin: Secondary | ICD-10-CM | POA: Insufficient documentation

## 2019-01-08 DIAGNOSIS — R200 Anesthesia of skin: Secondary | ICD-10-CM | POA: Insufficient documentation

## 2019-01-17 ENCOUNTER — Ambulatory Visit (HOSPITAL_COMMUNITY): Admission: RE | Admit: 2019-01-17 | Payer: Commercial Managed Care - PPO | Source: Ambulatory Visit

## 2019-03-09 ENCOUNTER — Ambulatory Visit: Payer: Self-pay | Admitting: Family Medicine

## 2019-03-11 ENCOUNTER — Other Ambulatory Visit: Payer: Self-pay

## 2019-03-11 ENCOUNTER — Ambulatory Visit (HOSPITAL_COMMUNITY)
Admission: RE | Admit: 2019-03-11 | Discharge: 2019-03-11 | Disposition: A | Discharge status: Home / Self Care | Payer: Commercial Managed Care - PPO | Source: Ambulatory Visit | Attending: Family Medicine | Admitting: Family Medicine

## 2019-03-11 ENCOUNTER — Ambulatory Visit: Payer: Commercial Managed Care - PPO

## 2019-03-11 DIAGNOSIS — R200 Anesthesia of skin: Secondary | ICD-10-CM | POA: Diagnosis present

## 2019-03-11 DIAGNOSIS — M79621 Pain in right upper arm: Secondary | ICD-10-CM | POA: Diagnosis present

## 2019-03-11 DIAGNOSIS — R202 Paresthesia of skin: Secondary | ICD-10-CM | POA: Diagnosis present

## 2019-03-23 ENCOUNTER — Ambulatory Visit (INDEPENDENT_AMBULATORY_CARE_PROVIDER_SITE_OTHER): Payer: Commercial Managed Care - PPO | Admitting: Family Medicine

## 2019-03-23 ENCOUNTER — Encounter: Payer: Self-pay | Admitting: Family Medicine

## 2019-03-23 ENCOUNTER — Other Ambulatory Visit: Payer: Self-pay

## 2019-03-23 VITALS — BP 147/89 | HR 67 | Temp 98.4°F | Ht 60.0 in | Wt 178.6 lb

## 2019-03-23 DIAGNOSIS — Z09 Encounter for follow-up examination after completed treatment for conditions other than malignant neoplasm: Secondary | ICD-10-CM

## 2019-03-23 DIAGNOSIS — F4321 Adjustment disorder with depressed mood: Secondary | ICD-10-CM | POA: Diagnosis not present

## 2019-03-23 DIAGNOSIS — R8290 Unspecified abnormal findings in urine: Secondary | ICD-10-CM

## 2019-03-23 DIAGNOSIS — M79621 Pain in right upper arm: Secondary | ICD-10-CM | POA: Diagnosis not present

## 2019-03-23 DIAGNOSIS — R200 Anesthesia of skin: Secondary | ICD-10-CM

## 2019-03-23 DIAGNOSIS — R202 Paresthesia of skin: Secondary | ICD-10-CM

## 2019-03-23 DIAGNOSIS — I10 Essential (primary) hypertension: Secondary | ICD-10-CM | POA: Diagnosis not present

## 2019-03-23 LAB — POCT URINALYSIS DIPSTICK
Glucose, UA: NEGATIVE
Leukocytes, UA: NEGATIVE
Nitrite, UA: NEGATIVE
Protein, UA: POSITIVE — AB
Spec Grav, UA: >=1.03 — AB (ref 1.010–1.025)
Urobilinogen, UA: 1 E.U./dL
pH, UA: 5.5 (ref 5.0–8.0)

## 2019-03-23 MED ORDER — IBUPROFEN 800 MG PO TABS: 800.0000 mg | ORAL_TABLET | Freq: Three times a day (TID) | ORAL | 3 refills | Status: DC | PRN

## 2019-03-23 NOTE — Progress Notes (Signed)
[facility]   Established Patient Office Visit  Subjective:  Patient ID: Melanie Shaw, female    DOB: 13-Jun-1975  Age: 44 y.o. MRN: [id number]  CC:  Chief Complaint  Patient presents with  . Follow-up    Review CT scan    HPI Melanie Shaw is a 44 year old female who presents for Follow Up today.   Past Medical History:  Diagnosis Date  . Headache   . Hypertension   . Numbness and tingling of right arm 12/2018  . PTSD (post-traumatic stress disorder)   . Vitamin D deficiency    Current Status: Since her last office visit, she is concerned about her continued right arm pain. She recently underwent CT scan which noted abnormality, but recommended MRI for further evaluation. Her anxiety is increased because of bill from CT scan and possibly spending more money for MRI. She states that she has not been taking any medication for pain relief, as she does not like taking medication. She reports that her right arm pain has increased especially when she uses it to lift items. She also recently had a death in the family. She denies suicidal ideations, homicidal ideations, or auditory hallucinations.She denies visual changes, chest pain, cough, shortness of breath, heart palpitations, and falls. She has occasional headaches and dizziness with position changes. Denies severe headaches, confusion, seizures, double vision, and blurred vision, nausea and vomiting. She denies fevers, chills, fatigue, recent infections, weight loss, and night sweats.  No reports of GI problems such as diarrhea, and constipation.  has no reports of blood in stools, dysuria and hematuria.   Past Surgical History:  Procedure Laterality Date  . ANKLE SURGERY    . CESAREAN SECTION    . MULTIPLE TOOTH EXTRACTIONS      Family History  Problem Relation Age of Onset  . Hypertension Mother     Social History   Socioeconomic History  . Marital status: Single    Spouse name: Not on file  . Number of children: Not on file  . Years of education: Not on file  . Highest education level: Not on file  Occupational History  . Not on file  Tobacco Use  . Smoking status: Current Some Day Smoker    Types: Cigars  . Smokeless tobacco: Never Used  Substance and Sexual Activity  . Alcohol use: Yes    Comment: occasionally   . Drug use: Never  . Sexual activity: Not Currently  Other Topics Concern  . Not on file  Social History Narrative  . Not on file   Social Determinants of Health   Financial Resource Strain:   . Difficulty of Paying Living Expenses: Not on file  Food Insecurity:   . Worried About Programme researcher, broadcasting/film/video in the Last Year: Not on file  . Ran Out of Food in the Last Year: Not on file  Transportation Needs:   . Lack of Transportation (Medical): Not on file  . Lack of Transportation (Non-Medical): Not on file  Physical Activity:   . Days of Exercise per Week: Not on file  . Minutes of Exercise per Session: Not on file  Stress:   . Feeling of Stress : Not on file  Social Connections:   . Frequency of Communication with Friends and Family: Not on file  . Frequency of Social Gatherings with Friends and Family: Not on file  . Attends Religious Services: Not on file  . Active  Member of Clubs or Organizations: Not on file  . Attends Banker Meetings: Not on file  . Marital Status: Not on file  Intimate Partner Violence:   . Fear of Current or Ex-Partner: Not on file  . Emotionally Abused: Not on file  . Physically Abused: Not on file  . Sexually Abused: Not on file    Outpatient Medications Prior to Visit  Medication Sig Dispense Refill  . acetaminophen (TYLENOL) 325 MG tablet Take 2 tablets (650 mg total) by mouth every 6 (six) hours as needed. 30 tablet 0  . methocarbamol (ROBAXIN) 500 MG tablet Take 1 tablet (500 mg total) by mouth 2 (two) times daily. (Patient not taking: Reported on 07/02/2018) 20 tablet 0  .  Vitamin D, Ergocalciferol, (DRISDOL) 1.25 MG (50000 UT) CAPS capsule Take 1 capsule (50,000 Units total) by mouth every 7 (seven) days. 5 capsule 3   No facility-administered medications prior to visit.    No Known Allergies  ROS Review of Systems  Constitutional: Negative.   HENT: Negative.   Eyes: Negative.   Respiratory: Negative.   Cardiovascular: Negative.   Gastrointestinal: Negative.   Endocrine: Negative.   Genitourinary: Negative.   Musculoskeletal: Negative.        Right upper arm/elbow pain  Skin: Negative.   Allergic/Immunologic: Negative.   Neurological: Positive for dizziness (occasional ) and headaches (occasional).  Hematological: Negative.   Psychiatric/Behavioral: The patient is nervous/anxious.       Objective:    Physical Exam  Constitutional: She is oriented to person, place, and time. She appears well-developed and well-nourished.  HENT:  Head: Normocephalic and atraumatic.  Eyes: Conjunctivae are normal.  Cardiovascular: Normal rate, regular rhythm, normal heart sounds and intact distal pulses.  Pulmonary/Chest: Effort normal and breath sounds normal.  Abdominal: Soft. Bowel sounds are normal. She exhibits distension.  Musculoskeletal:     Cervical back: Normal range of motion and neck supple.     Comments: Limited ROM in right arm.   Neurological: She is alert and oriented to person, place, and time. She has normal reflexes.  Skin: Skin is warm and dry.  Psychiatric: She has a normal mood and affect. Her behavior is normal. Judgment and thought content normal.  Nursing note and vitals reviewed.   BP (!) 147/89   Pulse 67   Temp 98.4 F (36.9 C) (Oral)   Ht 5' (1.524 m)   Wt 178 lb 9.6 oz (81 kg)   SpO2 98%   BMI 34.88 kg/m  Wt Readings from Last 3 Encounters:  03/23/19 178 lb 9.6 oz (81 kg)  01/07/19 185 lb 12.8 oz (84.3 kg)  07/02/18 172 lb 12.8 oz (78.4 kg)     Health Maintenance Due  Topic Date Due  . INFLUENZA VACCINE   09/25/2018    There are no preventive care reminders to display for this patient.  Lab Results  Component Value Date   TSH 2.610 03/24/2018   Lab Results  Component Value Date   WBC 5.5 03/24/2018   HGB 13.8 03/24/2018   HCT 41.5 03/24/2018   MCV 89 03/24/2018   PLT 298 03/24/2018   Lab Results  Component Value Date   NA 140 03/24/2018   K 4.7 03/24/2018   CO2 24 03/24/2018   GLUCOSE 83 03/24/2018   BUN 9 03/24/2018   CREATININE 1.07 (H) 03/24/2018   BILITOT 0.4 03/24/2018   ALKPHOS 58 03/24/2018   AST 15 03/24/2018   ALT 14 03/24/2018  PROT 6.6 03/24/2018   ALBUMIN 4.2 03/24/2018   CALCIUM 9.8 03/24/2018   Lab Results  Component Value Date   CHOL 125 03/24/2018   Lab Results  Component Value Date   HDL 60 03/24/2018   Lab Results  Component Value Date   LDLCALC 49 03/24/2018   Lab Results  Component Value Date   TRIG 80 03/24/2018   Lab Results  Component Value Date   CHOLHDL 2.1 03/24/2018   Lab Results  Component Value Date   HGBA1C 5.4 03/24/2018      Assessment & Plan:   1. Hypertension, unspecified type The current medical regimen is effective; blood pressure is stable at 147/89 today; continue present plan and medications as prescribed. She will continue to take medications as prescribed, to decrease high sodium intake, excessive alcohol intake, increase potassium intake, smoking cessation, and increase physical activity of at least 30 minutes of cardio activity daily. She will continue to follow Heart Healthy or DASH diet. - CBC with Differential - Comprehensive metabolic panel - TSH - Lipid Panel - Vitamin B12 - Vitamin D, 25-hydroxy  2. Pain in right upper arm We will initiate Motrin today. She has been informed to use RICE acronym to aid in pain. Recent abnormal CT and MRI is recommended. Patient does not to have MRI at this time. We will refer her to Orthopedic Surgery.  - ibuprofen (ADVIL) 800 MG tablet; Take 1 tablet (800 mg  total) by mouth every 8 (eight) hours as needed.  Dispense: 30 tablet; Refill: 3 -Ambulatory referral to Orthopedic Surgery  3. Numbness and tingling of upper extremity - ibuprofen (ADVIL) 800 MG tablet; Take 1 tablet (800 mg total) by mouth every 8 (eight) hours as needed.  Dispense: 30 tablet; Refill: 3  4. Grieving Stable.   5. Abnormal urinalysis Results are pending. - POCT urinalysis dipstick  6. Follow up She will follow up in 3 months.  - POCT urinalysis dipstick  Meds ordered this encounter  Medications  . ibuprofen (ADVIL) 800 MG tablet    Sig: Take 1 tablet (800 mg total) by mouth every 8 (eight) hours as needed.    Dispense:  30 tablet    Refill:  3    Orders Placed This Encounter  Procedures  . CBC with Differential  . Comprehensive metabolic panel  . TSH  . Lipid Panel  . Vitamin B12  . Vitamin D, 25-hydroxy  . Ambulatory referral to Orthopedic Surgery  . POCT urinalysis dipstick    Referral Orders     Ambulatory referral to [facility],  MSN, FNP-BC [address], [place] [address] [phone] fax   Problem List Items Addressed This Visit      Cardiovascular and Mediastinum   Hypertension - Primary   Relevant Orders   CBC with Differential   Comprehensive metabolic panel   TSH   Lipid Panel   Vitamin B12   Vitamin D, 25-hydroxy     Other   Numbness and tingling of upper extremity   Relevant Medications   ibuprofen (ADVIL) 800 MG tablet   Other Relevant Orders   Ambulatory referral to Orthopedic Surgery   Pain in right upper arm   Relevant Medications   ibuprofen (ADVIL) 800 MG tablet   Other Relevant Orders   Ambulatory referral to Orthopedic Surgery    Other Visit Diagnoses    Grieving  Abnormal urinalysis       Relevant Orders   POCT urinalysis dipstick (Completed)   Follow up       Relevant  Orders   POCT urinalysis dipstick (Completed)      Meds ordered this encounter  Medications  . ibuprofen (ADVIL) 800 MG tablet    Sig: Take 1 tablet (800 mg total) by mouth every 8 (eight) hours as needed.    Dispense:  30 tablet    Refill:  3    Follow-up: Return in about 3 months (around 06/21/2019).    Kallie Locks, FNP

## 2019-03-25 DIAGNOSIS — F4321 Adjustment disorder with depressed mood: Secondary | ICD-10-CM | POA: Insufficient documentation

## 2019-03-25 LAB — COMPREHENSIVE METABOLIC PANEL
ALT: 15 IU/L (ref 0–32)
AST: 17 IU/L (ref 0–40)
Albumin/Globulin Ratio: 1.8 (ref 1.2–2.2)
Albumin: 4.5 g/dL (ref 3.8–4.8)
Alkaline Phosphatase: 55 IU/L (ref 39–117)
BUN/Creatinine Ratio: 9 (ref 9–23)
BUN: 9 mg/dL (ref 6–24)
Bilirubin Total: 0.3 mg/dL (ref 0.0–1.2)
CO2: 22 mmol/L (ref 20–29)
Calcium: 9.6 mg/dL (ref 8.7–10.2)
Chloride: 105 mmol/L (ref 96–106)
Creatinine, Ser: 1.04 mg/dL — ABNORMAL HIGH (ref 0.57–1.00)
GFR calc Af Amer: 75 mL/min/{1.73_m2} (ref >59)
GFR calc non Af Amer: 65 mL/min/{1.73_m2} (ref >59)
Globulin, Total: 2.5 g/dL (ref 1.5–4.5)
Glucose: 81 mg/dL (ref 65–99)
Potassium: 4.2 mmol/L (ref 3.5–5.2)
Sodium: 140 mmol/L (ref 134–144)
Total Protein: 7 g/dL (ref 6.0–8.5)

## 2019-03-25 LAB — LIPID PANEL
Chol/HDL Ratio: 2.2 ratio (ref 0.0–4.4)
Cholesterol, Total: 135 mg/dL (ref 100–199)
HDL: 62 mg/dL (ref >39)
LDL Chol Calc (NIH): 51 mg/dL (ref 0–99)
Triglycerides: 128 mg/dL (ref 0–149)
VLDL Cholesterol Cal: 22 mg/dL (ref 5–40)

## 2019-03-25 LAB — CBC WITH DIFFERENTIAL/PLATELET
Basophils Absolute: 0 10*3/uL (ref 0.0–0.2)
Basos: 0 %
EOS (ABSOLUTE): 0.1 10*3/uL (ref 0.0–0.4)
Eos: 1 %
Hematocrit: 42.1 % (ref 34.0–46.6)
Hemoglobin: 14 g/dL (ref 11.1–15.9)
Immature Grans (Abs): 0 10*3/uL (ref 0.0–0.1)
Immature Granulocytes: 0 %
Lymphocytes Absolute: 4.8 10*3/uL — ABNORMAL HIGH (ref 0.7–3.1)
Lymphs: 49 %
MCH: 30 pg (ref 26.6–33.0)
MCHC: 33.3 g/dL (ref 31.5–35.7)
MCV: 90 fL (ref 79–97)
Monocytes Absolute: 0.4 10*3/uL (ref 0.1–0.9)
Monocytes: 5 %
Neutrophils Absolute: 4.3 10*3/uL (ref 1.4–7.0)
Neutrophils: 45 %
Platelets: 324 10*3/uL (ref 150–450)
RBC: 4.67 x10E6/uL (ref 3.77–5.28)
RDW: 13.7 % (ref 11.7–15.4)
WBC: 9.6 10*3/uL (ref 3.4–10.8)

## 2019-03-25 LAB — VITAMIN D 25 HYDROXY (VIT D DEFICIENCY, FRACTURES): Vit D, 25-Hydroxy: 18.9 ng/mL — ABNORMAL LOW (ref 30.0–100.0)

## 2019-03-25 LAB — VITAMIN B12: Vitamin B-12: 464 pg/mL (ref 232–1245)

## 2019-03-25 LAB — TSH: TSH: 3.11 u[IU]/mL (ref 0.450–4.500)

## 2019-03-28 ENCOUNTER — Other Ambulatory Visit: Payer: Self-pay

## 2019-03-28 DIAGNOSIS — E559 Vitamin D deficiency, unspecified: Secondary | ICD-10-CM

## 2019-03-28 MED ORDER — VITAMIN D (ERGOCALCIFEROL) 1.25 MG (50000 UNIT) PO CAPS: 50000.0000 [IU] | ORAL_CAPSULE | ORAL | 3 refills | Status: DC

## 2019-04-08 ENCOUNTER — Encounter: Payer: Self-pay | Admitting: Family Medicine

## 2019-04-08 ENCOUNTER — Ambulatory Visit: Payer: Commercial Managed Care - PPO | Admitting: Family Medicine

## 2019-04-08 ENCOUNTER — Other Ambulatory Visit: Payer: Self-pay

## 2019-04-08 DIAGNOSIS — G5621 Lesion of ulnar nerve, right upper limb: Secondary | ICD-10-CM

## 2019-04-08 DIAGNOSIS — M79631 Pain in right forearm: Secondary | ICD-10-CM | POA: Diagnosis not present
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
## 2019-04-08 NOTE — Patient Instructions (Addendum)
   Diagnosis:  Cubital Tunnel Syndrome  Take a B-Complex vitamin once daily

## 2019-04-12 ENCOUNTER — Ambulatory Visit: Payer: Commercial Managed Care - PPO

## 2019-04-15 ENCOUNTER — Ambulatory Visit
Admission: RE | Admit: 2019-04-15 | Discharge: 2019-04-15 | Disposition: A | Discharge status: Home / Self Care | Payer: Commercial Managed Care - PPO | Source: Ambulatory Visit | Attending: Family Medicine | Admitting: Family Medicine

## 2019-04-15 ENCOUNTER — Other Ambulatory Visit: Payer: Self-pay

## 2019-04-15 DIAGNOSIS — Z1239 Encounter for other screening for malignant neoplasm of breast: Secondary | ICD-10-CM

## 2019-04-22 ENCOUNTER — Telehealth: Payer: Self-pay | Admitting: Family Medicine

## 2019-04-22 MED ORDER — METHYLPREDNISOLONE 4 MG PO TBPK: ORAL_TABLET | ORAL | 0 refills | Status: DC

## 2019-04-22 NOTE — Telephone Encounter (Signed)
Pt called in stating she thought Dr. Prince Rome said he would prescribe her some medication when dhe came in for her appt on 04-08-19. Shes calling to check on that because her pharmacy hasn't gotten anything.

## 2019-04-22 NOTE — Telephone Encounter (Signed)
Patient aware of the below message  

## 2019-04-22 NOTE — Telephone Encounter (Signed)
I'll call in a medrol dose pack.  She might also want to get a B-Complex vitamin to take once daily.  Hand therapy will be the most helpful.

## 2019-06-24 ENCOUNTER — Ambulatory Visit: Payer: Self-pay | Admitting: Family Medicine

## 2020-02-13 ENCOUNTER — Encounter: Payer: Commercial Managed Care - PPO | Admitting: Family Medicine

## 2020-02-14 ENCOUNTER — Other Ambulatory Visit: Payer: Self-pay

## 2020-02-14 ENCOUNTER — Encounter: Payer: Self-pay | Admitting: Family Medicine

## 2020-02-14 ENCOUNTER — Ambulatory Visit (INDEPENDENT_AMBULATORY_CARE_PROVIDER_SITE_OTHER): Payer: Commercial Managed Care - PPO | Admitting: Family Medicine

## 2020-02-14 VITALS — BP 155/84 | HR 66 | Temp 97.1°F | Ht 60.0 in | Wt 175.0 lb

## 2020-02-14 DIAGNOSIS — Z01419 Encounter for gynecological examination (general) (routine) without abnormal findings: Secondary | ICD-10-CM

## 2020-02-14 DIAGNOSIS — Z124 Encounter for screening for malignant neoplasm of cervix: Secondary | ICD-10-CM | POA: Diagnosis not present

## 2020-02-14 DIAGNOSIS — Z09 Encounter for follow-up examination after completed treatment for conditions other than malignant neoplasm: Secondary | ICD-10-CM | POA: Diagnosis not present

## 2020-02-14 DIAGNOSIS — Z0000 Encounter for general adult medical examination without abnormal findings: Secondary | ICD-10-CM | POA: Diagnosis not present
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
## 2020-02-15 LAB — IGP, RFX APTIMA HPV ASCU

## 2020-02-21 ENCOUNTER — Encounter: Payer: Self-pay | Admitting: Family Medicine

## 2020-03-16 ENCOUNTER — Other Ambulatory Visit: Payer: Self-pay

## 2020-03-16 ENCOUNTER — Other Ambulatory Visit: Payer: Commercial Managed Care - PPO

## 2020-03-16 DIAGNOSIS — Z0000 Encounter for general adult medical examination without abnormal findings: Secondary | ICD-10-CM

## 2020-03-17 LAB — COMPREHENSIVE METABOLIC PANEL WITH GFR
ALT: 15 IU/L (ref 0–32)
AST: 22 IU/L (ref 0–40)
Albumin/Globulin Ratio: 2 (ref 1.2–2.2)
Albumin: 4.4 g/dL (ref 3.8–4.8)
Alkaline Phosphatase: 73 IU/L (ref 44–121)
BUN/Creatinine Ratio: 8 — ABNORMAL LOW (ref 9–23)
BUN: 7 mg/dL (ref 6–24)
Bilirubin Total: 0.4 mg/dL (ref 0.0–1.2)
CO2: 24 mmol/L (ref 20–29)
Calcium: 9.2 mg/dL (ref 8.7–10.2)
Chloride: 105 mmol/L (ref 96–106)
Creatinine, Ser: 0.92 mg/dL (ref 0.57–1.00)
GFR calc Af Amer: 86 mL/min/1.73 (ref >59)
GFR calc non Af Amer: 75 mL/min/1.73 (ref >59)
Globulin, Total: 2.2 g/dL (ref 1.5–4.5)
Glucose: 85 mg/dL (ref 65–99)
Potassium: 4.5 mmol/L (ref 3.5–5.2)
Sodium: 138 mmol/L (ref 134–144)
Total Protein: 6.6 g/dL (ref 6.0–8.5)

## 2020-03-17 LAB — CBC WITH DIFFERENTIAL/PLATELET
Basophils Absolute: 0 x10E3/uL (ref 0.0–0.2)
Basos: 1 %
EOS (ABSOLUTE): 0 x10E3/uL (ref 0.0–0.4)
Eos: 1 %
Hematocrit: 43.1 % (ref 34.0–46.6)
Hemoglobin: 14.1 g/dL (ref 11.1–15.9)
Immature Grans (Abs): 0 x10E3/uL (ref 0.0–0.1)
Immature Granulocytes: 1 %
Lymphocytes Absolute: 2.6 x10E3/uL (ref 0.7–3.1)
Lymphs: 60 %
MCH: 28.7 pg (ref 26.6–33.0)
MCHC: 32.7 g/dL (ref 31.5–35.7)
MCV: 88 fL (ref 79–97)
Monocytes Absolute: 0.5 x10E3/uL (ref 0.1–0.9)
Monocytes: 11 %
Neutrophils Absolute: 1.1 x10E3/uL — ABNORMAL LOW (ref 1.4–7.0)
Neutrophils: 26 %
Platelets: 272 x10E3/uL (ref 150–450)
RBC: 4.92 x10E6/uL (ref 3.77–5.28)
RDW: 13.2 % (ref 11.7–15.4)
WBC: 4.3 x10E3/uL (ref 3.4–10.8)

## 2020-03-17 LAB — LIPID PANEL
Chol/HDL Ratio: 2.2 ratio (ref 0.0–4.4)
Cholesterol, Total: 108 mg/dL (ref 100–199)
HDL: 50 mg/dL (ref >39)
LDL Chol Calc (NIH): 38 mg/dL (ref 0–99)
Triglycerides: 106 mg/dL (ref 0–149)
VLDL Cholesterol Cal: 20 mg/dL (ref 5–40)

## 2020-03-17 LAB — VITAMIN B12: Vitamin B-12: 560 pg/mL (ref 232–1245)

## 2020-03-17 LAB — VITAMIN D 25 HYDROXY (VIT D DEFICIENCY, FRACTURES): Vit D, 25-Hydroxy: 10.2 ng/mL — ABNORMAL LOW (ref 30.0–100.0)

## 2020-03-17 LAB — TSH: TSH: 1.86 u[IU]/mL (ref 0.450–4.500)

## 2020-03-19 ENCOUNTER — Other Ambulatory Visit: Payer: Self-pay | Admitting: Family Medicine

## 2020-03-19 DIAGNOSIS — E559 Vitamin D deficiency, unspecified: Secondary | ICD-10-CM

## 2020-03-19 MED ORDER — VITAMIN D (ERGOCALCIFEROL) 1.25 MG (50000 UNIT) PO CAPS: 50000.0000 [IU] | ORAL_CAPSULE | ORAL | 6 refills | Status: DC

## 2020-04-19 ENCOUNTER — Other Ambulatory Visit: Payer: Self-pay | Admitting: Family Medicine

## 2020-04-19 DIAGNOSIS — Z1231 Encounter for screening mammogram for malignant neoplasm of breast: Secondary | ICD-10-CM

## 2020-05-31 ENCOUNTER — Encounter (HOSPITAL_COMMUNITY): Payer: Self-pay

## 2020-05-31 ENCOUNTER — Other Ambulatory Visit: Payer: Self-pay

## 2020-05-31 ENCOUNTER — Ambulatory Visit (HOSPITAL_COMMUNITY)
Admission: EM | Admit: 2020-05-31 | Discharge: 2020-05-31 | Disposition: A | Discharge status: Home / Self Care | Payer: Commercial Managed Care - PPO | Attending: Emergency Medicine | Admitting: Emergency Medicine

## 2020-05-31 ENCOUNTER — Ambulatory Visit (INDEPENDENT_AMBULATORY_CARE_PROVIDER_SITE_OTHER): Payer: Commercial Managed Care - PPO

## 2020-05-31 DIAGNOSIS — M25561 Pain in right knee: Secondary | ICD-10-CM

## 2020-05-31 DIAGNOSIS — M25461 Effusion, right knee: Secondary | ICD-10-CM | POA: Diagnosis not present

## 2020-05-31 MED ORDER — NAPROXEN 500 MG PO TABS: 500.0000 mg | ORAL_TABLET | Freq: Two times a day (BID) | ORAL | 0 refills | Status: DC
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
## 2020-05-31 NOTE — Discharge Instructions (Signed)
Take the Naproxen as needed for pain.   You can use RICE:  RICE: Rest Ice for 10-15 minutes every 4-6 hours as needed for pain and swelling Compression- wrap your knee with ace bandage Elevate your knee above your hip while sitting.   Follow up with orthopedic or sports medicine.   Return or go to the Emergency Department if symptoms worsen or do not improve in the next few days.

## 2020-06-08 ENCOUNTER — Inpatient Hospital Stay: Admission: RE | Admit: 2020-06-08 | Payer: Commercial Managed Care - PPO | Source: Ambulatory Visit

## 2020-06-14 ENCOUNTER — Ambulatory Visit
Admission: RE | Admit: 2020-06-14 | Discharge: 2020-06-14 | Disposition: A | Discharge status: Home / Self Care | Payer: Commercial Managed Care - PPO | Source: Ambulatory Visit | Attending: Family Medicine | Admitting: Family Medicine

## 2020-06-14 ENCOUNTER — Other Ambulatory Visit: Payer: Self-pay

## 2020-06-14 DIAGNOSIS — Z1231 Encounter for screening mammogram for malignant neoplasm of breast: Secondary | ICD-10-CM

## 2020-06-15 ENCOUNTER — Ambulatory Visit: Payer: Commercial Managed Care - PPO | Admitting: Family Medicine

## 2020-06-21 ENCOUNTER — Ambulatory Visit: Payer: Commercial Managed Care - PPO | Admitting: Nurse Practitioner

## 2020-08-19 ENCOUNTER — Other Ambulatory Visit: Payer: Self-pay

## 2020-08-19 ENCOUNTER — Emergency Department (HOSPITAL_COMMUNITY)
Admission: EM | Admit: 2020-08-19 | Discharge: 2020-08-20 | Disposition: A | Discharge status: Home / Self Care | Payer: Commercial Managed Care - PPO | Attending: Emergency Medicine | Admitting: Emergency Medicine

## 2020-08-19 DIAGNOSIS — M79602 Pain in left arm: Secondary | ICD-10-CM | POA: Diagnosis not present

## 2020-08-19 DIAGNOSIS — R079 Chest pain, unspecified: Secondary | ICD-10-CM | POA: Diagnosis present

## 2020-08-19 DIAGNOSIS — F17290 Nicotine dependence, other tobacco product, uncomplicated: Secondary | ICD-10-CM | POA: Diagnosis not present

## 2020-08-19 DIAGNOSIS — I10 Essential (primary) hypertension: Secondary | ICD-10-CM | POA: Diagnosis not present

## 2020-08-19 DIAGNOSIS — R0789 Other chest pain: Secondary | ICD-10-CM

## 2020-08-19 DIAGNOSIS — R110 Nausea: Secondary | ICD-10-CM | POA: Diagnosis not present

## 2020-08-19 NOTE — ED Triage Notes (Signed)
Pt came in with c/o chest and l arm pain. Pt states that two days ago she started feeling as if a rubberband was around her arm. Today her L chest started feeling tight.

## 2020-08-20 ENCOUNTER — Emergency Department (HOSPITAL_COMMUNITY): Payer: Commercial Managed Care - PPO

## 2020-08-20 LAB — CBC WITH DIFFERENTIAL/PLATELET
Abs Immature Granulocytes: 0.02 10*3/uL (ref 0.00–0.07)
Basophils Absolute: 0 10*3/uL (ref 0.0–0.1)
Basophils Relative: 1 %
Eosinophils Absolute: 0.1 10*3/uL (ref 0.0–0.5)
Eosinophils Relative: 1 %
HCT: 42.1 % (ref 36.0–46.0)
Hemoglobin: 13.7 g/dL (ref 12.0–15.0)
Immature Granulocytes: 0 %
Lymphocytes Relative: 42 %
Lymphs Abs: 3.4 10*3/uL (ref 0.7–4.0)
MCH: 29 pg (ref 26.0–34.0)
MCHC: 32.5 g/dL (ref 30.0–36.0)
MCV: 89 fL (ref 80.0–100.0)
Monocytes Absolute: 0.6 10*3/uL (ref 0.1–1.0)
Monocytes Relative: 8 %
Neutro Abs: 3.9 10*3/uL (ref 1.7–7.7)
Neutrophils Relative %: 48 %
Platelets: 305 10*3/uL (ref 150–400)
RBC: 4.73 MIL/uL (ref 3.87–5.11)
RDW: 14.6 % (ref 11.5–15.5)
WBC: 8.1 10*3/uL (ref 4.0–10.5)
nRBC: 0 % (ref 0.0–0.2)

## 2020-08-20 LAB — BASIC METABOLIC PANEL
Anion gap: 9 (ref 5–15)
BUN: 10 mg/dL (ref 6–20)
CO2: 23 mmol/L (ref 22–32)
Calcium: 9.1 mg/dL (ref 8.9–10.3)
Chloride: 105 mmol/L (ref 98–111)
Creatinine, Ser: 0.82 mg/dL (ref 0.44–1.00)
GFR, Estimated: >60 mL/min (ref >60)
Glucose, Bld: 78 mg/dL (ref 70–99)
Potassium: 3.7 mmol/L (ref 3.5–5.1)
Sodium: 137 mmol/L (ref 135–145)

## 2020-08-20 LAB — TROPONIN I (HIGH SENSITIVITY)
Troponin I (High Sensitivity): 6 ng/L (ref <18)
Troponin I (High Sensitivity): 6 ng/L (ref <18)

## 2020-08-20 MED ORDER — CYCLOBENZAPRINE HCL 10 MG PO TABS: 10.0000 mg | ORAL_TABLET | Freq: Three times a day (TID) | ORAL | 0 refills | Status: DC | PRN

## 2020-08-20 MED ORDER — IBUPROFEN 200 MG PO TABS
400.0000 mg | ORAL_TABLET | Freq: Once | ORAL | Status: AC
  Administered 2020-08-20: 400 mg via ORAL
  Filled 2020-08-20: qty 2
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
## 2020-08-20 NOTE — Discharge Instructions (Addendum)
Your evaluation today did not show any sign of any heart or lung problem.  I suspect that you have a pinched nerve in your neck.  Take ibuprofen or naproxen as needed for pain.  For additional pain relief, you may add acetaminophen.  Return if symptoms are getting worse.  Your blood pressure was high today.  Please have it rechecked in the next 1-2 weeks.  If it is staying high, you will need to be on medication to control it.  Inadequately controlled high blood pressure can lead to heart attacks, strokes, kidney failure.

## 2021-05-07 ENCOUNTER — Other Ambulatory Visit: Payer: Self-pay | Admitting: Nurse Practitioner

## 2021-05-07 DIAGNOSIS — Z1231 Encounter for screening mammogram for malignant neoplasm of breast: Secondary | ICD-10-CM

## 2021-05-14 ENCOUNTER — Other Ambulatory Visit: Payer: Self-pay

## 2021-05-14 ENCOUNTER — Ambulatory Visit: Payer: Commercial Managed Care - PPO | Admitting: Nurse Practitioner

## 2021-05-14 ENCOUNTER — Encounter: Payer: Self-pay | Admitting: Nurse Practitioner

## 2021-05-14 VITALS — BP 173/104 | HR 71 | Temp 98.3°F | Ht 60.0 in | Wt 179.0 lb

## 2021-05-14 DIAGNOSIS — Z0000 Encounter for general adult medical examination without abnormal findings: Secondary | ICD-10-CM

## 2021-05-14 DIAGNOSIS — I160 Hypertensive urgency: Secondary | ICD-10-CM | POA: Diagnosis not present

## 2021-05-14 DIAGNOSIS — G5601 Carpal tunnel syndrome, right upper limb: Secondary | ICD-10-CM

## 2021-05-14 DIAGNOSIS — M25531 Pain in right wrist: Secondary | ICD-10-CM | POA: Diagnosis not present

## 2021-05-14 DIAGNOSIS — I10 Essential (primary) hypertension: Secondary | ICD-10-CM | POA: Diagnosis not present

## 2021-05-14 DIAGNOSIS — G4709 Other insomnia: Secondary | ICD-10-CM

## 2021-05-14 LAB — POCT URINALYSIS DIP (CLINITEK)
Bilirubin, UA: NEGATIVE
Glucose, UA: NEGATIVE mg/dL
Ketones, POC UA: NEGATIVE mg/dL
Leukocytes, UA: NEGATIVE
Nitrite, UA: NEGATIVE
POC PROTEIN,UA: 30 — AB
Spec Grav, UA: 1.025 (ref 1.010–1.025)
Urobilinogen, UA: 0.2 E.U./dL
pH, UA: 5.5 (ref 5.0–8.0)

## 2021-05-14 MED ORDER — LOSARTAN POTASSIUM-HCTZ 50-12.5 MG PO TABS: 1.0000 | ORAL_TABLET | Freq: Every day | ORAL | 2 refills | Status: DC

## 2021-05-14 NOTE — Progress Notes (Signed)
? ?[facility] ?[address] ?[place], [place]  [address] ?Phone:  [phone]   Fax:  [phone] ?Subjective:  ? Patient ID: Melanie Shaw, female    DOB: 04-06-1975, 46 y.o.   MRN: [id number] ? ?Chief Complaint  ?Patient presents with  ? Annual Exam  ?  Pt is here for physical. Pt stated she has irregular menstrual, trouble sleeping  and concerned about her BP  ? ?HPI ?Melanie Shaw 46 y.o. female  has a past medical history of Headache, Hypertension, Numbness and tingling of right arm (12/2018), PTSD (post-traumatic stress disorder), and Vitamin D deficiency. To the [facility] to establish care and for wellness visit.  ? ?When questioned about elevated B/P, states that she was diagnosed with hypertension, and was prescribed medications in the past, but has never taken prescribed medications. Currently does not monitor diet, but exercises multiple days per week. Currently works completing background checks and with computers.  ? ?Also concerned about right wrist numbness and pain x 1-2 yrs. Has been evaluated and treated with no improvement in symptoms. States that often drops items due to weakness in right wrist.  ? ?Also concerned about changes in menstrual cycle, it has changed in heaviness of flow and started at a later date. Using 5 menstrual pads per day. LMP February 14, current cycle began 2-3 days ago. Patient also endorses problems with insomnia, has difficulty staying asleep. Has tried melatonin with no improvement in symptoms.  ? ?Patient verbalizes that she does not like taking medications. Requesting herbal recommendations.  ? ?Denies any other concerns today. Denies any fever. Denies any fatigue, chest pain, shortness of breath, HA or dizziness. Denies any blurred vision, numbness or tingling. ? ?Past Medical History:  ?Diagnosis Date  ? Headache   ? Hypertension   ? Numbness and tingling of right arm 12/2018  ? PTSD (post-traumatic stress disorder)   ?  Vitamin D deficiency   ? ? ?Past Surgical History:  ?Procedure Laterality Date  ? ANKLE SURGERY    ? CESAREAN SECTION    ? MULTIPLE TOOTH EXTRACTIONS    ? ? ?Family History  ?Problem Relation Age of Onset  ? Hypertension Mother   ? Breast cancer Paternal Aunt   ? ? ?Social History  ? ?Socioeconomic History  ? Marital status: Single  ?  Spouse name: Not on file  ? Number of children: Not on file  ? Years of education: Not on file  ? Highest education level: Not on file  ?Occupational History  ? Not on file  ?Tobacco Use  ? Smoking status: Some Days  ?  Types: Cigars  ? Smokeless tobacco: Never  ?Vaping Use  ? Vaping Use: Never used  ?Substance and Sexual Activity  ? Alcohol use: Yes  ?  Comment: occasionally   ? Drug use: Never  ? Sexual activity: Not Currently  ?Other Topics Concern  ? Not on file  ?Social History Narrative  ? Not on file  ? ?Social Determinants of Health  ? ?Financial Resource Strain: Not on file  ?Food Insecurity: Not on file  ?Transportation Needs: Not on file  ?Physical Activity: Not on file  ?Stress: Not on file  ?Social Connections: Not on file  ?Intimate Partner Violence: Not on file  ? ? ?Outpatient Medications Prior to Visit  ?Medication Sig Dispense Refill  ? Acetaminophen (TYLENOL 8 HOUR PO) Take 1-2 tablets by mouth every 8 (eight) hours as needed (pain). (Patient not taking: Reported on  05/14/2021)    ? cyclobenzaprine (FLEXERIL) 10 MG tablet Take 1 tablet (10 mg total) by mouth 3 (three) times daily as needed for muscle spasms. (Patient not taking: Reported on 05/14/2021) 30 tablet 0  ? ibuprofen (ADVIL) 800 MG tablet Take 1 tablet (800 mg total) by mouth every 8 (eight) hours as needed. (Patient not taking: Reported on 05/14/2021) 30 tablet 3  ? ?No facility-administered medications prior to visit.  ? ? ?No Known Allergies ? ?Review of Systems  ?Constitutional:  Negative for chills and fever.  ?HENT: Negative.    ?Eyes: Negative.   ?Respiratory:  Negative for cough and shortness of  breath.   ?Cardiovascular:  Negative for chest pain, palpitations and leg swelling.  ?Gastrointestinal:  Negative for abdominal pain, blood in stool, constipation, diarrhea, nausea and vomiting.  ?Genitourinary:  Negative for dysuria, flank pain, frequency, hematuria and urgency.  ?     See HPI  ?Musculoskeletal:  Positive for joint pain. Negative for back pain, falls, myalgias and neck pain.  ?Skin: Negative.   ?Neurological:  Positive for sensory change and focal weakness. Negative for dizziness, tingling, tremors, speech change, seizures, loss of consciousness, weakness and headaches.  ?Psychiatric/Behavioral:  Negative for depression. The patient has insomnia. The patient is not nervous/anxious.   ?All other systems reviewed and are negative. ? ?   ?Objective:  ?  ?Physical Exam ?Vitals reviewed.  ?Constitutional:   ?   General: She is not in acute distress. ?   Appearance: Normal appearance. She is obese.  ?HENT:  ?   Head: Normocephalic.  ?   Right Ear: Tympanic membrane, ear canal and external ear normal. There is no impacted cerumen.  ?   Left Ear: Tympanic membrane, ear canal and external ear normal. There is no impacted cerumen.  ?   Nose: Nose normal. No congestion or rhinorrhea.  ?   Mouth/Throat:  ?   Mouth: Mucous membranes are moist.  ?   Pharynx: Oropharynx is clear. No oropharyngeal exudate or posterior oropharyngeal erythema.  ?Eyes:  ?   General: No scleral icterus.    ?   Right eye: No discharge.     ?   Left eye: No discharge.  ?   Extraocular Movements: Extraocular movements intact.  ?   Conjunctiva/sclera: Conjunctivae normal.  ?   Pupils: Pupils are equal, round, and reactive to light.  ?Neck:  ?   Vascular: No carotid bruit.  ?Cardiovascular:  ?   Rate and Rhythm: Normal rate and regular rhythm.  ?   Pulses: Normal pulses.  ?   Heart sounds: Normal heart sounds.  ?   Comments: No obvious peripheral edema ?Pulmonary:  ?   Effort: Pulmonary effort is normal.  ?   Breath sounds: Normal breath  sounds.  ?Abdominal:  ?   General: Abdomen is flat. Bowel sounds are normal. There is no distension.  ?   Palpations: Abdomen is soft. There is no mass.  ?   Tenderness: There is no abdominal tenderness. There is no right CVA tenderness, left CVA tenderness, guarding or rebound.  ?   Hernia: No hernia is present.  ?Musculoskeletal:     ?   General: Tenderness present. No swelling, deformity or signs of injury. Normal range of motion.  ?   Cervical back: Normal range of motion and neck supple. No rigidity or tenderness.  ?   Right lower leg: No edema.  ?   Left lower leg: No edema.  ?  Comments: Positive phalen's and tinel's signs  ?Lymphadenopathy:  ?   Cervical: No cervical adenopathy.  ?Skin: ?   General: Skin is warm and dry.  ?   Capillary Refill: Capillary refill takes less than 2 seconds.  ?Neurological:  ?   General: No focal deficit present.  ?   Mental Status: She is alert and oriented to person, place, and time.  ?Psychiatric:     ?   Mood and Affect: Mood normal.     ?   Behavior: Behavior normal.     ?   Thought Content: Thought content normal.     ?   Judgment: Judgment normal.  ? ? ?BP (!) 173/104 (BP Location: Left Arm, Cuff Size: Normal)   Pulse 71   Temp 98.3 ?F (36.8 ?C)   Ht 5' (1.524 m)   Wt 179 lb (81.2 kg)   SpO2 100%   BMI 34.96 kg/m?  ?Wt Readings from Last 3 Encounters:  ?05/14/21 179 lb (81.2 kg)  ?08/19/20 176 lb (79.8 kg)  ?02/14/20 175 lb (79.4 kg)  ? ? ?Immunization History  ?Administered Date(s) Administered  ? Tdap 03/06/2016  ? ? ?Diabetic Foot Exam - Simple   ?No data filed ?  ? ? ?Lab Results  ?Component Value Date  ? TSH 1.860 03/16/2020  ? ?Lab Results  ?Component Value Date  ? WBC 8.1 08/20/2020  ? HGB 13.7 08/20/2020  ? HCT 42.1 08/20/2020  ? MCV 89.0 08/20/2020  ? PLT 305 08/20/2020  ? ?Lab Results  ?Component Value Date  ? NA 137 08/20/2020  ? K 3.7 08/20/2020  ? CO2 23 08/20/2020  ? GLUCOSE 78 08/20/2020  ? BUN 10 08/20/2020  ? CREATININE 0.82 08/20/2020  ? BILITOT  0.4 03/16/2020  ? ALKPHOS 73 03/16/2020  ? AST 22 03/16/2020  ? ALT 15 03/16/2020  ? PROT 6.6 03/16/2020  ? ALBUMIN 4.4 03/16/2020  ? CALCIUM 9.1 08/20/2020  ? ANIONGAP 9 08/20/2020  ? ?Lab Results  ?Component Value Date

## 2021-05-14 NOTE — Patient Instructions (Signed)
You were seen today in the [facility] to establish care. Labs were collected, results will be available via MyChart or, if abnormal, you will be contacted by clinic staff. You were prescribed medications, please take as directed. Please follow up in 1 mth for pap smear and wellness visit.  ? ?Please get wrist splint for carpal tunnel.  ?

## 2021-05-15 LAB — LIPID PANEL
Chol/HDL Ratio: 1.9 ratio (ref 0.0–4.4)
Cholesterol, Total: 120 mg/dL (ref 100–199)
HDL: 64 mg/dL (ref >39)
LDL Chol Calc (NIH): 44 mg/dL (ref 0–99)
Triglycerides: 53 mg/dL (ref 0–149)
VLDL Cholesterol Cal: 12 mg/dL (ref 5–40)

## 2021-05-15 LAB — COMPREHENSIVE METABOLIC PANEL
ALT: 13 IU/L (ref 0–32)
AST: 18 IU/L (ref 0–40)
Albumin/Globulin Ratio: 2.2 (ref 1.2–2.2)
Albumin: 4.3 g/dL (ref 3.8–4.8)
Alkaline Phosphatase: 57 IU/L (ref 44–121)
BUN/Creatinine Ratio: 10 (ref 9–23)
BUN: 9 mg/dL (ref 6–24)
Bilirubin Total: 0.2 mg/dL (ref 0.0–1.2)
CO2: 23 mmol/L (ref 20–29)
Calcium: 9.2 mg/dL (ref 8.7–10.2)
Chloride: 105 mmol/L (ref 96–106)
Creatinine, Ser: 0.88 mg/dL (ref 0.57–1.00)
Globulin, Total: 2 g/dL (ref 1.5–4.5)
Glucose: 100 mg/dL — ABNORMAL HIGH (ref 70–99)
Potassium: 4.9 mmol/L (ref 3.5–5.2)
Sodium: 140 mmol/L (ref 134–144)
Total Protein: 6.3 g/dL (ref 6.0–8.5)
eGFR: 82 mL/min/{1.73_m2} (ref >59)

## 2021-05-15 LAB — CBC WITH DIFFERENTIAL/PLATELET
Basophils Absolute: 0.1 10*3/uL (ref 0.0–0.2)
Basos: 1 %
EOS (ABSOLUTE): 0.1 10*3/uL (ref 0.0–0.4)
Eos: 1 %
Hematocrit: 42.4 % (ref 34.0–46.6)
Hemoglobin: 13.6 g/dL (ref 11.1–15.9)
Immature Grans (Abs): 0 10*3/uL (ref 0.0–0.1)
Immature Granulocytes: 0 %
Lymphocytes Absolute: 2.6 10*3/uL (ref 0.7–3.1)
Lymphs: 44 %
MCH: 28.7 pg (ref 26.6–33.0)
MCHC: 32.1 g/dL (ref 31.5–35.7)
MCV: 90 fL (ref 79–97)
Monocytes Absolute: 0.5 10*3/uL (ref 0.1–0.9)
Monocytes: 8 %
Neutrophils Absolute: 2.7 10*3/uL (ref 1.4–7.0)
Neutrophils: 46 %
Platelets: 315 10*3/uL (ref 150–450)
RBC: 4.74 x10E6/uL (ref 3.77–5.28)
RDW: 13.9 % (ref 11.7–15.4)
WBC: 5.9 10*3/uL (ref 3.4–10.8)

## 2021-05-15 LAB — HEMOGLOBIN A1C
Est. average glucose Bld gHb Est-mCnc: 117 mg/dL
Hgb A1c MFr Bld: 5.7 % — ABNORMAL HIGH (ref 4.8–5.6)

## 2021-06-14 ENCOUNTER — Ambulatory Visit (INDEPENDENT_AMBULATORY_CARE_PROVIDER_SITE_OTHER): Payer: Commercial Managed Care - PPO | Admitting: Nurse Practitioner

## 2021-06-14 VITALS — BP 158/99 | HR 78 | Temp 98.0°F | Ht 60.0 in | Wt 177.4 lb

## 2021-06-14 DIAGNOSIS — G4709 Other insomnia: Secondary | ICD-10-CM

## 2021-06-14 DIAGNOSIS — Z01419 Encounter for gynecological examination (general) (routine) without abnormal findings: Secondary | ICD-10-CM | POA: Diagnosis not present

## 2021-06-14 DIAGNOSIS — I160 Hypertensive urgency: Secondary | ICD-10-CM | POA: Diagnosis not present

## 2021-06-14 DIAGNOSIS — I10 Essential (primary) hypertension: Secondary | ICD-10-CM | POA: Diagnosis not present

## 2021-06-14 MED ORDER — LOSARTAN POTASSIUM-HCTZ 50-12.5 MG PO TABS: 2.0000 | ORAL_TABLET | Freq: Every day | ORAL | 2 refills | Status: DC

## 2021-06-14 MED ORDER — LOSARTAN POTASSIUM-HCTZ 50-12.5 MG PO TABS: 1.0000 | ORAL_TABLET | Freq: Every day | ORAL | 2 refills | Status: DC

## 2021-06-14 NOTE — Progress Notes (Signed)
? ?[place] ?[place], [place]  [address] ?Phone:  [phone]   Fax:  [phone] ?Subjective:  ? Patient ID: Melanie Shaw, female    DOB: 03/27/1975, 46 y.o.   MRN: [id number] ? ?Chief Complaint  ?Patient presents with  ? Follow-up  ?  Pt is here for 1 month follow up pap smear   ? ?HPI ?Melanie Shaw 46 y.o. female  has a past medical history of Headache, Hypertension, Numbness and tingling of right arm (12/2018), PTSD (post-traumatic stress disorder), and Vitamin D deficiency. To the [facility] for reevaluation of hypertension and pap smear. ? ?States that she has not had any partners in the past 6 mths, heterosexual preference. Denies any vaginal concerns. Completed SBE at home regularly.  ? ?When questioned about elevated B/P, patient states that she has been generally compliant with B/P medications, but has missed a few days. Does not monitor B/P at home or monitor meals. Last dose of B/P medication last night.  ? ?Since last visit, patient states that pain in bilateral wrists has improved with usage of wrist splints.  ? ?Denies any other concerns today. Denies any fatigue, chest pain, shortness of breath, HA or dizziness. Denies any blurred vision, numbness or tingling. ? ?Past Medical History:  ?Diagnosis Date  ? Headache   ? Hypertension   ? Numbness and tingling of right arm 12/2018  ? PTSD (post-traumatic stress disorder)   ? Vitamin D deficiency   ? ? ?Past Surgical History:  ?Procedure Laterality Date  ? ANKLE SURGERY    ? CESAREAN SECTION    ? MULTIPLE TOOTH EXTRACTIONS    ? ? ?Family History  ?Problem Relation Age of Onset  ? Hypertension Mother   ? Breast cancer Paternal Aunt   ? ? ?Social History  ? ?Socioeconomic History  ? Marital status: Single  ?  Spouse name: Not on file  ? Number of children: Not on file  ? Years of education: Not on file  ? Highest education level: Not on file  ?Occupational History  ? Not on file  ?Tobacco Use  ?  Smoking status: Some Days  ?  Types: Cigars  ? Smokeless tobacco: Never  ?Vaping Use  ? Vaping Use: Never used  ?Substance and Sexual Activity  ? Alcohol use: Yes  ?  Comment: occasionally   ? Drug use: Never  ? Sexual activity: Not Currently  ?Other Topics Concern  ? Not on file  ?Social History Narrative  ? Not on file  ? ?Social Determinants of Health  ? ?Financial Resource Strain: Not on file  ?Food Insecurity: Not on file  ?Transportation Needs: Not on file  ?Physical Activity: Not on file  ?Stress: Not on file  ?Social Connections: Not on file  ?Intimate Partner Violence: Not on file  ? ? ?Outpatient Medications Prior to Visit  ?Medication Sig Dispense Refill  ? losartan-hydrochlorothiazide (HYZAAR) 50-12.5 MG tablet Take 1 tablet by mouth daily. 30 tablet 2  ? Acetaminophen (TYLENOL 8 HOUR PO) Take 1-2 tablets by mouth every 8 (eight) hours as needed (pain). (Patient not taking: Reported on 05/14/2021)    ? cyclobenzaprine (FLEXERIL) 10 MG tablet Take 1 tablet (10 mg total) by mouth 3 (three) times daily as needed for muscle spasms. (Patient not taking: Reported on 05/14/2021) 30 tablet 0  ? ibuprofen (ADVIL) 800 MG tablet Take 1 tablet (800 mg total) by mouth every 8 (eight) hours as needed. (Patient not taking: Reported  on 05/14/2021) 30 tablet 3  ? ?No facility-administered medications prior to visit.  ? ? ?No Known Allergies ? ?Review of Systems  ?Constitutional:  Negative for chills, fever and malaise/fatigue.  ?Eyes: Negative.   ?Respiratory:  Negative for cough and shortness of breath.   ?Cardiovascular:  Negative for chest pain, palpitations and leg swelling.  ?Gastrointestinal:  Negative for abdominal pain, blood in stool, constipation, diarrhea, nausea and vomiting.  ?Genitourinary: Negative.   ?Skin: Negative.   ?Neurological: Negative.   ?Psychiatric/Behavioral:  Negative for depression. The patient is not nervous/anxious.   ?All other systems reviewed and are negative. ? ?   ?Objective:  ?   ?Physical Exam ?Vitals reviewed. Exam conducted with a chaperone present.  ?Constitutional:   ?   General: She is not in acute distress. ?   Appearance: Normal appearance. She is obese.  ?HENT:  ?   Head: Normocephalic.  ?Cardiovascular:  ?   Rate and Rhythm: Normal rate and regular rhythm.  ?   Pulses: Normal pulses.  ?   Heart sounds: Normal heart sounds.  ?   Comments: No obvious peripheral edema ?Pulmonary:  ?   Effort: Pulmonary effort is normal.  ?   Breath sounds: Normal breath sounds.  ?Chest:  ?   Chest wall: No mass.  ?Breasts: ?   Right: Normal. No swelling, bleeding, inverted nipple, mass, nipple discharge, skin change or tenderness.  ?   Left: Normal. No swelling, bleeding, inverted nipple, mass, nipple discharge, skin change or tenderness.  ?Genitourinary: ?   Labia:     ?   Right: No rash, tenderness, lesion or injury.     ?   Left: No rash, tenderness, lesion or injury.   ?   Vagina: Normal. No signs of injury and foreign body. No vaginal discharge, erythema, tenderness, bleeding, lesions or prolapsed vaginal walls.  ?   Cervix: No cervical motion tenderness.  ?   Rectum: Normal.  ?Lymphadenopathy:  ?   Upper Body:  ?   Right upper body: No axillary adenopathy.  ?   Left upper body: No axillary adenopathy.  ?Skin: ?   General: Skin is warm and dry.  ?   Capillary Refill: Capillary refill takes less than 2 seconds.  ?Neurological:  ?   Mental Status: She is alert.  ?Psychiatric:     ?   Mood and Affect: Mood normal.     ?   Behavior: Behavior normal.     ?   Thought Content: Thought content normal.     ?   Judgment: Judgment normal.  ? ? ?BP (!) 158/99 (BP Location: Right Arm, Patient Position: Sitting, Cuff Size: Normal)   Pulse 78   Temp 98 ?F (36.7 ?C)   Ht 5' (1.524 m)   Wt 177 lb 6.4 oz (80.5 kg)   LMP 06/11/2021   SpO2 100%   BMI 34.65 kg/m?  ?Wt Readings from Last 3 Encounters:  ?06/14/21 177 lb 6.4 oz (80.5 kg)  ?05/14/21 179 lb (81.2 kg)  ?08/19/20 176 lb (79.8 kg)  ? ? ?Immunization  History  ?Administered Date(s) Administered  ? Tdap 03/06/2016  ? ? ?Diabetic Foot Exam - Simple   ?No data filed ?  ? ? ?Lab Results  ?Component Value Date  ? TSH 1.860 03/16/2020  ? ?Lab Results  ?Component Value Date  ? WBC 5.9 05/14/2021  ? HGB 13.6 05/14/2021  ? HCT 42.4 05/14/2021  ? MCV 90 05/14/2021  ? PLT 315 05/14/2021  ? ?  Lab Results  ?Component Value Date  ? NA 140 05/14/2021  ? K 4.9 05/14/2021  ? CO2 23 05/14/2021  ? GLUCOSE 100 (H) 05/14/2021  ? BUN 9 05/14/2021  ? CREATININE 0.88 05/14/2021  ? BILITOT 0.2 05/14/2021  ? ALKPHOS 57 05/14/2021  ? AST 18 05/14/2021  ? ALT 13 05/14/2021  ? PROT 6.3 05/14/2021  ? ALBUMIN 4.3 05/14/2021  ? CALCIUM 9.2 05/14/2021  ? ANIONGAP 9 08/20/2020  ? EGFR 82 05/14/2021  ? ?Lab Results  ?Component Value Date  ? CHOL 120 05/14/2021  ? CHOL 108 03/16/2020  ? CHOL 135 03/23/2019  ? ?Lab Results  ?Component Value Date  ? HDL 64 05/14/2021  ? HDL 50 03/16/2020  ? HDL 62 03/23/2019  ? ?Lab Results  ?Component Value Date  ? LDLCALC 44 05/14/2021  ? [place] 38 03/16/2020  ? [place] 51 03/23/2019  ? ?Lab Results  ?Component Value Date  ? TRIG 53 05/14/2021  ? TRIG 106 03/16/2020  ? TRIG 128 03/23/2019  ? ?Lab Results  ?Component Value Date  ? CHOLHDL 1.9 05/14/2021  ? CHOLHDL 2.2 03/16/2020  ? CHOLHDL 2.2 03/23/2019  ? ?Lab Results  ?Component Value Date  ? HGBA1C 5.7 (H) 05/14/2021  ? HGBA1C 5.4 03/24/2018  ? ? ?   ?Assessment & Plan:  ? ?Problem List Items Addressed This Visit   ? ?  ? Cardiovascular and Mediastinum  ? Hypertension  ? Relevant Medications  ? losartan-hydrochlorothiazide (HYZAAR) 50-12.5 MG tablet ?Encouraged continued diet and exercise efforts  ?Encouraged continued compliance with medication  ?  ? ?Other Visit Diagnoses   ? ? Women's annual routine gynecological examination    -  Primary  ? Relevant Orders  ? Pap IG and Chlamydia/Gonococcus, NAA ([facility])  ? NuSwab Vaginitis Plus (VG+) ?Encouraged continued self breast exams ?Encouraged to maintain  upcoming mammogram   ? Other insomnia     ?Discussed non pharmacological methods for management of symptoms ?Informed to take OTC medications as needed ?  ? Hypertensive urgency      ? Relevant Medications  ? losartan-hydr

## 2021-06-14 NOTE — Patient Instructions (Signed)
You were seen today in the [facility] for pap smear. Labs were collected, results will be available via MyChart or, if abnormal, you will be contacted by clinic staff. You were prescribed medications, please take as directed. Please follow up in 1 mths for reevaluation of B/P ?

## 2021-06-17 ENCOUNTER — Ambulatory Visit
Admission: RE | Admit: 2021-06-17 | Discharge: 2021-06-17 | Disposition: A | Discharge status: Home / Self Care | Payer: Commercial Managed Care - PPO | Source: Ambulatory Visit | Attending: Nurse Practitioner | Admitting: Nurse Practitioner

## 2021-06-17 ENCOUNTER — Other Ambulatory Visit: Payer: Self-pay | Admitting: Nurse Practitioner

## 2021-06-17 ENCOUNTER — Encounter: Payer: Self-pay | Admitting: Nurse Practitioner

## 2021-06-17 DIAGNOSIS — B9689 Other specified bacterial agents as the cause of diseases classified elsewhere: Secondary | ICD-10-CM

## 2021-06-17 DIAGNOSIS — Z1231 Encounter for screening mammogram for malignant neoplasm of breast: Secondary | ICD-10-CM

## 2021-06-17 MED ORDER — METRONIDAZOLE 500 MG PO TABS: 500.0000 mg | ORAL_TABLET | Freq: Two times a day (BID) | ORAL | 0 refills | Status: AC

## 2021-06-18 LAB — NUSWAB VAGINITIS PLUS (VG+)
BVAB 2: HIGH Score — AB
Candida albicans, NAA: NEGATIVE
Candida glabrata, NAA: NEGATIVE
Chlamydia trachomatis, NAA: NEGATIVE
Megasphaera 1: HIGH Score — AB
Neisseria gonorrhoeae, NAA: NEGATIVE
Trich vag by NAA: NEGATIVE

## 2021-06-20 LAB — PAP IG AND CT-NG NAA
Chlamydia, Nuc. Acid Amp: NEGATIVE
Gonococcus by Nucleic Acid Amp: NEGATIVE

## 2021-07-19 ENCOUNTER — Encounter: Payer: Self-pay | Admitting: Nurse Practitioner

## 2021-07-19 ENCOUNTER — Ambulatory Visit: Payer: Commercial Managed Care - PPO | Admitting: Nurse Practitioner

## 2021-07-19 VITALS — BP 166/87 | HR 61 | Temp 97.6°F | Ht 60.0 in | Wt 173.4 lb

## 2021-07-19 DIAGNOSIS — I10 Essential (primary) hypertension: Secondary | ICD-10-CM

## 2021-07-19 DIAGNOSIS — I160 Hypertensive urgency: Secondary | ICD-10-CM

## 2021-07-19 MED ORDER — LOSARTAN POTASSIUM-HCTZ 100-25 MG PO TABS: 1.0000 | ORAL_TABLET | Freq: Every day | ORAL | 1 refills | Status: AC

## 2021-07-19 MED ORDER — AMLODIPINE BESYLATE 10 MG PO TABS: 10.0000 mg | ORAL_TABLET | Freq: Every day | ORAL | 1 refills | Status: AC

## 2021-07-19 NOTE — Patient Instructions (Signed)
You were seen today in the [facility] for reevaluation of HTN and weight. You were prescribed medications, please take as directed. Please follow up in 3 mths for reevaluation.

## 2021-07-19 NOTE — Progress Notes (Unsigned)
 [facility] [address] [place], [place]  [address] Phone:  [phone]   Fax:  [phone] Subjective:   Patient ID: Melanie Shaw, female    DOB: 05/27/1975, 46 y.o.   MRN: [id number]  Chief Complaint  Patient presents with   Follow-up    1 month follow up; Hypertension Patient has no concerns or issues to discuss today.   HPI Melanie Shaw 46 y.o. female  has a past medical history of Headache, Hypertension, Numbness and tingling of right arm (12/2018), PTSD (post-traumatic stress disorder), and Vitamin D deficiency. To [facility] for reevaluation of HTN.  Hypertension: Patient here for follow-up of elevated blood pressure. She is exercising and is adherent to low salt diet.  Blood pressure is not well controlled at home. Cardiac symptoms none. Patient denies chest pain, dyspnea, and fatigue.  Cardiovascular risk factors: hypertension and obesity (BMI >= 30 kg/m2). Use of agents associated with hypertension: none. History of target organ damage: none. States that she has been checking B/P at home, similar to today's values.  Denies any other concerns today. Denies any fatigue, chest pain, shortness of breath, HA or dizziness. Denies any blurred vision, numbness or tingling.  Past Medical History:  Diagnosis Date   Headache    Hypertension    Numbness and tingling of right arm 12/2018   PTSD (post-traumatic stress disorder)    Vitamin D deficiency     Past Surgical History:  Procedure Laterality Date   ANKLE SURGERY     CESAREAN SECTION     MULTIPLE TOOTH EXTRACTIONS      Family History  Problem Relation Age of Onset   Hypertension Mother    Breast cancer Paternal Aunt     Social History   Socioeconomic History   Marital status: Single    Spouse name: Not on file   Number of children: Not on file   Years of education: Not on file   Highest education level: Not on file  Occupational History   Not on file  Tobacco Use    Smoking status: Some Days    Types: Cigars   Smokeless tobacco: Never  Vaping Use   Vaping Use: Never used  Substance and Sexual Activity   Alcohol use: Yes    Comment: occasionally    Drug use: Not Currently    Types: Marijuana   Sexual activity: Not Currently  Other Topics Concern   Not on file  Social History Narrative   Not on file   Social Determinants of Health   Financial Resource Strain: Not on file  Food Insecurity: Not on file  Transportation Needs: Not on file  Physical Activity: Not on file  Stress: Not on file  Social Connections: Not on file  Intimate Partner Violence: Not on file    Outpatient Medications Prior to Visit  Medication Sig Dispense Refill   losartan-hydrochlorothiazide (HYZAAR) 50-12.5 MG tablet Take 1 tablet by mouth daily. 30 tablet 2   Acetaminophen (TYLENOL 8 HOUR PO) Take 1-2 tablets by mouth every 8 (eight) hours as needed (pain). (Patient not taking: Reported on 05/14/2021)     cyclobenzaprine (FLEXERIL) 10 MG tablet Take 1 tablet (10 mg total) by mouth 3 (three) times daily as needed for muscle spasms. (Patient not taking: Reported on 05/14/2021) 30 tablet 0   ibuprofen (ADVIL) 800 MG tablet Take 1 tablet (800 mg total) by mouth every 8 (eight) hours as needed. (Patient not taking: Reported on 05/14/2021) 30   tablet 3   No facility-administered medications prior to visit.    No Known Allergies  Review of Systems  Constitutional:  Negative for chills, fever and malaise/fatigue.  HENT: Negative.    Eyes: Negative.   Respiratory:  Negative for cough and shortness of breath.   Cardiovascular:  Negative for chest pain, palpitations and leg swelling.  Gastrointestinal:  Negative for abdominal pain, blood in stool, constipation, diarrhea, nausea and vomiting.  Skin: Negative.   Neurological: Negative.   Psychiatric/Behavioral:  Negative for depression. The patient is not nervous/anxious.   All other systems reviewed and are negative.      Objective:    Physical Exam Vitals reviewed.  Constitutional:      General: She is not in acute distress.    Appearance: Normal appearance. She is obese.  HENT:     Head: Normocephalic.  Neck:     Vascular: No carotid bruit.  Cardiovascular:     Rate and Rhythm: Normal rate and regular rhythm.     Pulses: Normal pulses.     Heart sounds: Normal heart sounds.     Comments: No obvious peripheral edema Pulmonary:     Effort: Pulmonary effort is normal.     Breath sounds: Normal breath sounds.  Musculoskeletal:        General: No swelling, tenderness, deformity or signs of injury. Normal range of motion.     Cervical back: Normal range of motion and neck supple. No rigidity or tenderness.     Right lower leg: No edema.     Left lower leg: No edema.  Lymphadenopathy:     Cervical: No cervical adenopathy.  Skin:    General: Skin is warm and dry.     Capillary Refill: Capillary refill takes less than 2 seconds.  Neurological:     General: No focal deficit present.     Mental Status: She is alert and oriented to person, place, and time.  Psychiatric:        Mood and Affect: Mood normal.        Behavior: Behavior normal.        Thought Content: Thought content normal.        Judgment: Judgment normal.    BP (!) 166/87   Pulse 61   Temp 97.6 F (36.4 C)   Ht 5' (1.524 m)   Wt 173 lb 6.4 oz (78.7 kg)   SpO2 100%   BMI 33.86 kg/m  Wt Readings from Last 3 Encounters:  07/19/21 173 lb 6.4 oz (78.7 kg)  06/14/21 177 lb 6.4 oz (80.5 kg)  05/14/21 179 lb (81.2 kg)    Immunization History  Administered Date(s) Administered   Tdap 03/06/2016    Diabetic Foot Exam - Simple   No data filed     Lab Results  Component Value Date   TSH 1.860 03/16/2020   Lab Results  Component Value Date   WBC 5.9 05/14/2021   HGB 13.6 05/14/2021   HCT 42.4 05/14/2021   MCV 90 05/14/2021   PLT 315 05/14/2021   Lab Results  Component Value Date   NA 140 05/14/2021   K 4.9  05/14/2021   CO2 23 05/14/2021   GLUCOSE 100 (H) 05/14/2021   BUN 9 05/14/2021   CREATININE 0.88 05/14/2021   BILITOT 0.2 05/14/2021   ALKPHOS 57 05/14/2021   AST 18 05/14/2021   ALT 13 05/14/2021   PROT 6.3 05/14/2021   ALBUMIN 4.3 05/14/2021   CALCIUM 9.2 05/14/2021  ANIONGAP 9 08/20/2020   EGFR 82 05/14/2021   Lab Results  Component Value Date   CHOL 120 05/14/2021   CHOL 108 03/16/2020   CHOL 135 03/23/2019   Lab Results  Component Value Date   HDL 64 05/14/2021   HDL 50 03/16/2020   HDL 62 03/23/2019   Lab Results  Component Value Date   LDLCALC 44 05/14/2021   LDLCALC 38 03/16/2020   LDLCALC 51 03/23/2019   Lab Results  Component Value Date   TRIG 53 05/14/2021   TRIG 106 03/16/2020   TRIG 128 03/23/2019   Lab Results  Component Value Date   CHOLHDL 1.9 05/14/2021   CHOLHDL 2.2 03/16/2020   CHOLHDL 2.2 03/23/2019   Lab Results  Component Value Date   HGBA1C 5.7 (H) 05/14/2021   HGBA1C 5.4 03/24/2018       Assessment & Plan:   Problem List Items Addressed This Visit       Cardiovascular and Mediastinum   Hypertension - Primary   Relevant Medications   amLODipine (NORVASC) 10 MG tablet, added to therapy    losartan-hydrochlorothiazide (HYZAAR) 100-25 MG tablet, dosage increased Encouraged continued diet and exercise efforts  Encouraged continued compliance with medication  Encouraged to begin checking B/P regularly at home    Other Visit Diagnoses     Hypertensive urgency       Relevant Medications   amLODipine (NORVASC) 10 MG tablet   losartan-hydrochlorothiazide (HYZAAR) 100-25 MG tablet   Follow up in 3 mths for reevaluation of HTN and weight, sooner as needed     I have discontinued Jonelle Sidle T. Tolle's ibuprofen, Acetaminophen (TYLENOL 8 HOUR PO), cyclobenzaprine, and losartan-hydrochlorothiazide. I am also having her start on amLODipine and losartan-hydrochlorothiazide.  Meds ordered this encounter  Medications   amLODipine  (NORVASC) 10 MG tablet    Sig: Take 1 tablet (10 mg total) by mouth daily.    Dispense:  90 tablet    Refill:  1   losartan-hydrochlorothiazide (HYZAAR) 100-25 MG tablet    Sig: Take 1 tablet by mouth daily.    Dispense:  90 tablet    Refill:  1     Teena Dunk, NP

## 2022-05-13 ENCOUNTER — Other Ambulatory Visit: Payer: Self-pay | Admitting: Family Medicine

## 2022-05-13 DIAGNOSIS — Z1231 Encounter for screening mammogram for malignant neoplasm of breast: Secondary | ICD-10-CM

## 2022-06-23 IMAGING — MG MM DIGITAL SCREENING BILAT W/ TOMO AND CAD
8 series · 8 of 24 positions shown · non-contrast
Comparison: Previous exam(s).

CLINICAL DATA: Screening.

EXAM:
DIGITAL SCREENING BILATERAL MAMMOGRAM WITH TOMOSYNTHESIS AND CAD
TECHNIQUE: Bilateral screening digital craniocaudal and mediolateral oblique
mammograms were obtained. Bilateral screening digital breast
tomosynthesis was performed. The images were evaluated with
computer-aided detection.

[R CC synth-2D]
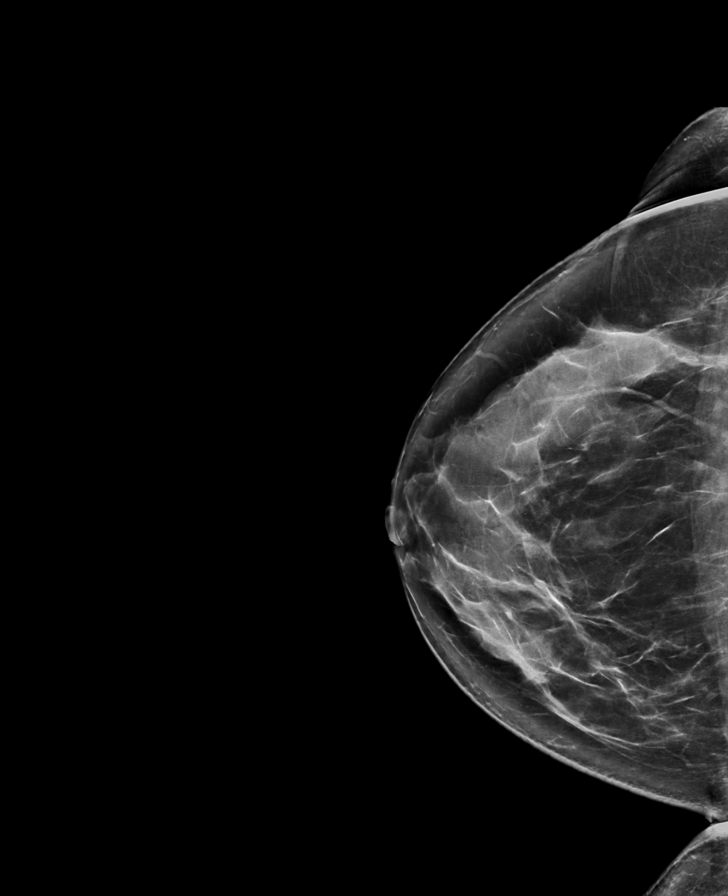

[L CC synth-2D]
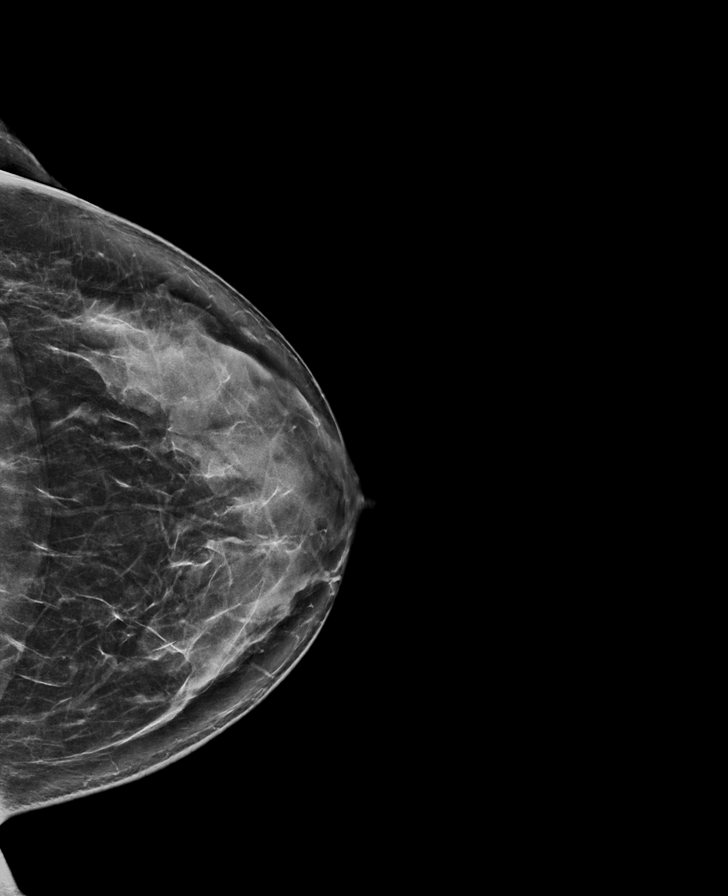

[R MLO synth-2D]
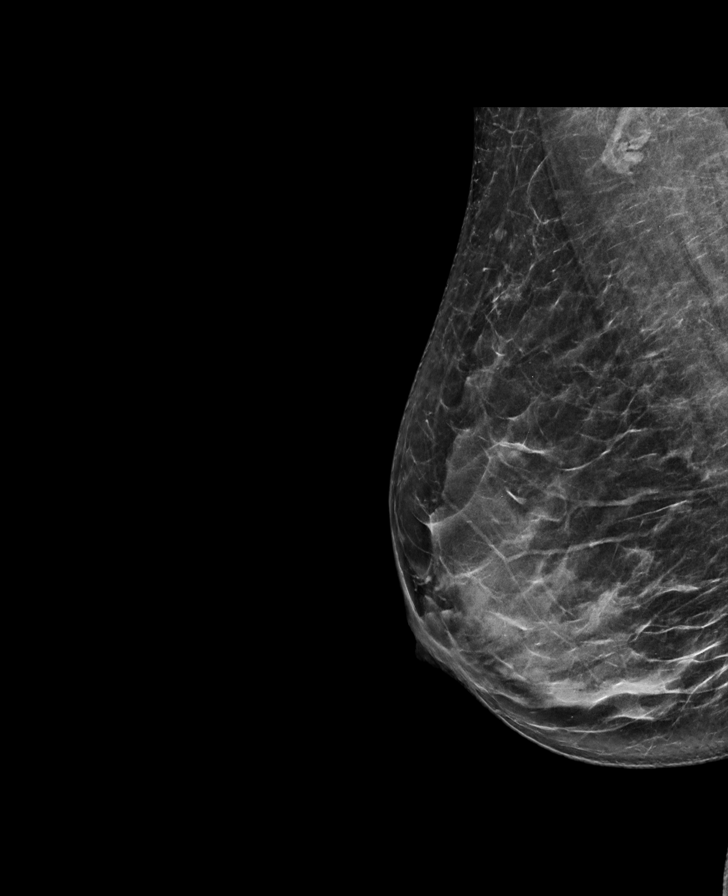

[L MLO synth-2D]
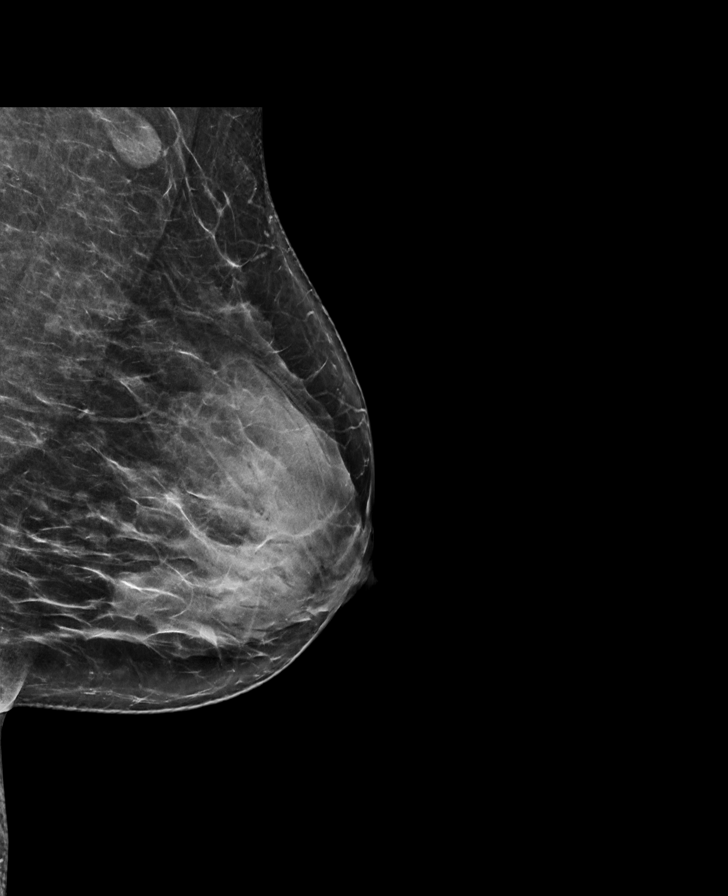

[L MLO tomo · tomo slice 39/76.0]
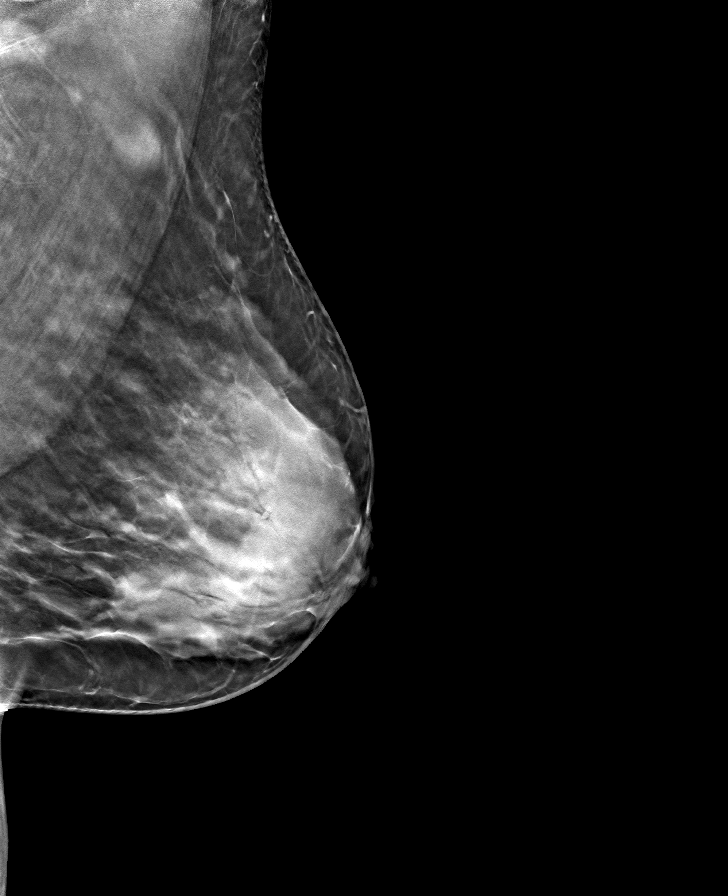

[R MLO tomo · tomo slice 35/69.0]
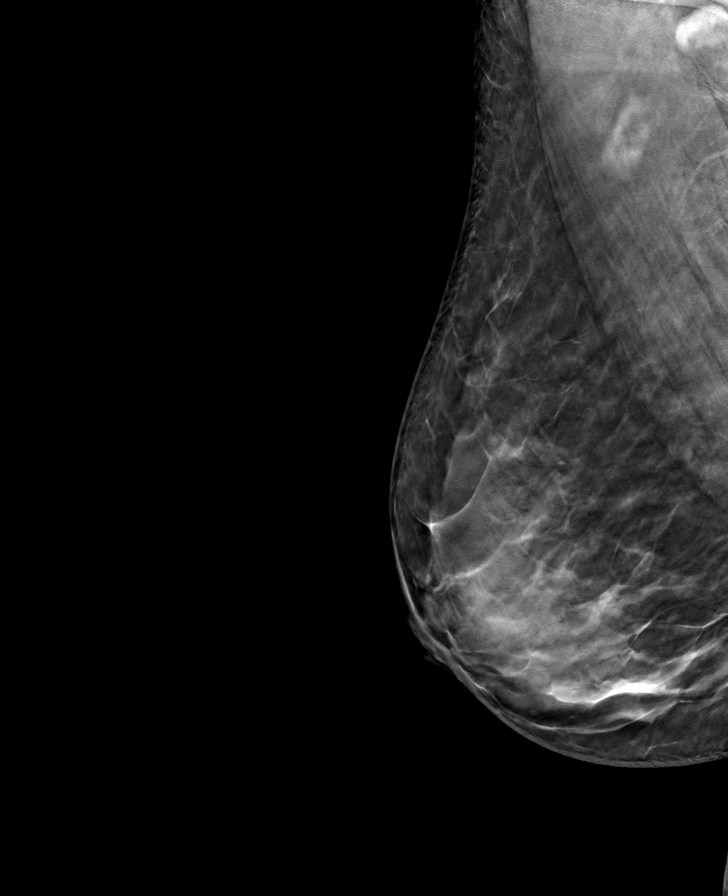

[R CC tomo · tomo slice 39/78.0]
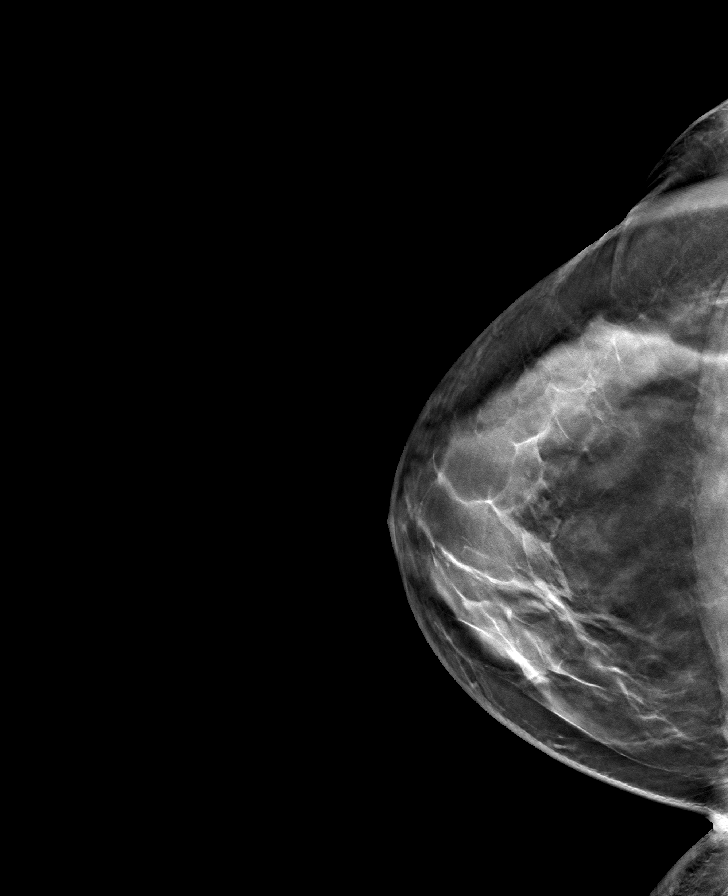

[L CC tomo · tomo slice 39/76.0]
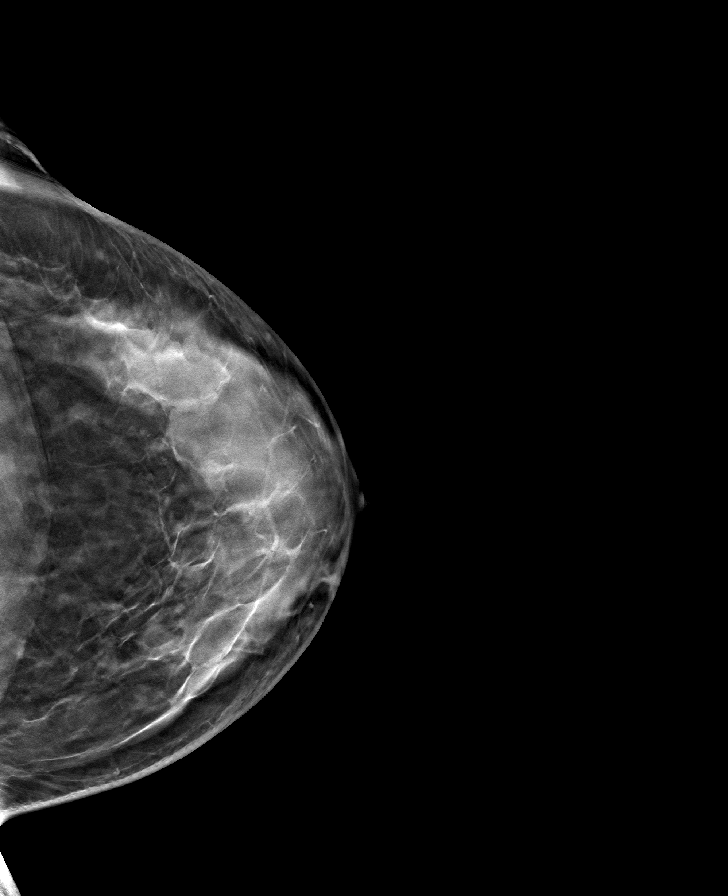

[8 of 24 positions shown; findings below may reference images not displayed]

ACR Breast Density Category c: The breast tissue is heterogeneously
dense, which may obscure small masses.
FINDINGS: There are no findings suspicious for malignancy.
IMPRESSION: No mammographic evidence of malignancy. A result letter of this
screening mammogram will be mailed directly to the patient.

RECOMMENDATION:
Screening mammogram in one year. (Code:Q3-W-BC3)

BI-RADS CATEGORY  1: Negative.

## 2022-06-24 ENCOUNTER — Ambulatory Visit
Admission: RE | Admit: 2022-06-24 | Discharge: 2022-06-24 | Disposition: A | Discharge status: Home / Self Care | Payer: Commercial Managed Care - PPO | Source: Ambulatory Visit | Attending: Nurse Practitioner | Admitting: Nurse Practitioner

## 2022-06-24 DIAGNOSIS — Z1231 Encounter for screening mammogram for malignant neoplasm of breast: Secondary | ICD-10-CM

## 2023-02-12 ENCOUNTER — Encounter: Payer: Commercial Managed Care - PPO | Admitting: Family Medicine

## 2023-02-12 ENCOUNTER — Telehealth: Payer: Self-pay | Admitting: *Deleted

## 2023-02-12 NOTE — Telephone Encounter (Signed)
LVM to have pt call office to let her know that we can see her today to establish care however we will not be able to do her physical due to her appointment being scheduled wrong.  We can schedule her to come back for her physical after she establishes care.

## 2023-02-12 NOTE — Progress Notes (Deleted)
   New Patient Office Visit  Subjective   Patient ID: Melanie Shaw, female    DOB: [birth year]  Age: 47 y.o. MRN: [id number]  CC: No chief complaint on file.   HPI Melanie Shaw presents to establish care ***  PMH: Hypertension, MDD  PSH: ***  FH: ***  Tobacco use: *** Alcohol use: *** Drug use: *** Marital status: *** Employment: *** Sexual hx: ***  Screenings:  Colon Cancer: *** Lung Cancer: *** Breast Cancer: *** Diabetes: *** HLD: ***   Outpatient Encounter Medications as of 02/12/2023  Medication Sig   amLODipine (NORVASC) 10 MG tablet Take 1 tablet (10 mg total) by mouth daily.   losartan-hydrochlorothiazide (HYZAAR) 100-25 MG tablet Take 1 tablet by mouth daily.   No facility-administered encounter medications on file as of 02/12/2023.    Past Medical History:  Diagnosis Date   Headache    Hypertension    Numbness and tingling of right arm 12/2018   PTSD (post-traumatic stress disorder)    Vitamin D deficiency     Past Surgical History:  Procedure Laterality Date   ANKLE SURGERY     CESAREAN SECTION     MULTIPLE TOOTH EXTRACTIONS      Family History  Problem Relation Age of Onset   Hypertension Mother    Breast cancer Paternal Aunt     Social History   Socioeconomic History   Marital status: Single    Spouse name: Not on file   Number of children: Not on file   Years of education: Not on file   Highest education level: Not on file  Occupational History   Not on file  Tobacco Use   Smoking status: Some Days    Types: Cigars   Smokeless tobacco: Never  Vaping Use   Vaping status: Never Used  Substance and Sexual Activity   Alcohol use: Yes    Comment: occasionally    Drug use: Not Currently    Types: Marijuana   Sexual activity: Not Currently  Other Topics Concern   Not on file  Social History Narrative   Not on file   Social Drivers of Health   Financial Resource Strain: Not on file  Food  Insecurity: Not on file  Transportation Needs: Not on file  Physical Activity: Not on file  Stress: Not on file  Social Connections: Not on file  Intimate Partner Violence: Not on file    ROS     Objective   There were no vitals taken for this visit.  Physical Exam     Assessment & Plan:   There are no diagnoses linked to this encounter.  No follow-ups on file.   Sandre Kitty, MD

## 2023-03-10 ENCOUNTER — Ambulatory Visit (INDEPENDENT_AMBULATORY_CARE_PROVIDER_SITE_OTHER): Payer: Commercial Managed Care - PPO | Admitting: Nurse Practitioner

## 2023-03-10 ENCOUNTER — Encounter: Payer: Self-pay | Admitting: Nurse Practitioner

## 2023-03-10 VITALS — BP 185/85 | HR 57 | Temp 97.1°F | Wt 174.0 lb

## 2023-03-10 DIAGNOSIS — E559 Vitamin D deficiency, unspecified: Secondary | ICD-10-CM

## 2023-03-10 DIAGNOSIS — Z124 Encounter for screening for malignant neoplasm of cervix: Secondary | ICD-10-CM | POA: Insufficient documentation

## 2023-03-10 DIAGNOSIS — H6121 Impacted cerumen, right ear: Secondary | ICD-10-CM

## 2023-03-10 DIAGNOSIS — R7303 Prediabetes: Secondary | ICD-10-CM | POA: Insufficient documentation

## 2023-03-10 DIAGNOSIS — Z0000 Encounter for general adult medical examination without abnormal findings: Secondary | ICD-10-CM | POA: Insufficient documentation

## 2023-03-10 DIAGNOSIS — F324 Major depressive disorder, single episode, in partial remission: Secondary | ICD-10-CM

## 2023-03-10 DIAGNOSIS — Z1211 Encounter for screening for malignant neoplasm of colon: Secondary | ICD-10-CM | POA: Diagnosis not present

## 2023-03-10 DIAGNOSIS — F17200 Nicotine dependence, unspecified, uncomplicated: Secondary | ICD-10-CM | POA: Diagnosis not present

## 2023-03-10 DIAGNOSIS — G4700 Insomnia, unspecified: Secondary | ICD-10-CM | POA: Insufficient documentation

## 2023-03-10 DIAGNOSIS — Z1329 Encounter for screening for other suspected endocrine disorder: Secondary | ICD-10-CM

## 2023-03-10 DIAGNOSIS — Z13228 Encounter for screening for other metabolic disorders: Secondary | ICD-10-CM

## 2023-03-10 DIAGNOSIS — Z1321 Encounter for screening for nutritional disorder: Secondary | ICD-10-CM

## 2023-03-10 DIAGNOSIS — Z130 Encounter for screening for diseases of the blood and blood-forming organs and certain disorders involving the immune mechanism: Secondary | ICD-10-CM

## 2023-03-10 DIAGNOSIS — I10 Essential (primary) hypertension: Secondary | ICD-10-CM

## 2023-03-10 MED ORDER — DEBROX 6.5 % OT SOLN: 5.0000 [drp] | Freq: Two times a day (BID) | OTIC | 0 refills | Status: AC

## 2023-03-10 NOTE — Assessment & Plan Note (Signed)
 Annual exam as documented.  Counseling done include healthy lifestyle involving committing to 150 minutes of exercise per week, heart healthy diet, and attaining healthy weight. The importance of adequate sleep also discussed.   Changes in health habits are decided on by patient with goals and time frames set for achieving them. Immunization and cancer screening  needs are specifically addressed at this visit.   Up-to-date with mammogram up-to-date with cervical cancer screening but she would like a Pap smear done today. She declined flu vaccine patient encouraged to consider getting the vaccine Referral sent for colonoscopy

## 2023-03-10 NOTE — Assessment & Plan Note (Signed)
 BP Readings from Last 3 Encounters:  03/10/23 (!) 185/85  07/19/21 (!) 166/87  06/14/21 (!) 158/99   unControlled .  Patient is not interested in restarting her medication Need to take medication to help prevent kidney disease, stroke, heart attack discussed but the patient refused medication I encouraged the patient to monitor her blood pressure at home keep a log and bring to next visit. Patient instructed to go to the emergency room right away if she develops chest pain, shortness of breath, headache,  Discussed DASH diet and dietary sodium restrictions Continue to increase dietary efforts and exercise.  Checking CMP

## 2023-03-10 NOTE — Assessment & Plan Note (Signed)
 Flowsheet Row Office Visit from 03/10/2023 in [facility]  PHQ-9 Total Score 13     Currently not on medication She denies SI, HI Will follow-up on this at next visit

## 2023-03-10 NOTE — Progress Notes (Signed)
 Complete physical exam  Patient: Melanie Shaw   DOB: 09-Nov-1975   48 y.o. Female  MRN: [id number]  Subjective:    Chief Complaint  Patient presents with   Annual Exam    Not fasting    Melanie Shaw is a 48 y.o. female  has a past medical history of Headache, Hypertension, Numbness and tingling of right arm (12/2018), PTSD (post-traumatic stress disorder), and Vitamin D  deficiency. who presents today for a complete physical exam. She reports consuming a  lot of fruits , vegetable, walks  the dogs 3 times a day, 20-35 minutes daily. She generally feels well. She reports sleeping poorly.    Hypertension.  Patient stated that she took herself off her medications.  Does not check blood pressure at home.  She denies chest pain shortness of breath edema  Insomnia.  Patient complains of insomnia, snoring, states that she wakes up in the night gasping for air, gets 2 and half hours of sleep nightly, she plays sleep music to help her sleep longer.  Her parents have sleep apnea and uses a CPAP machine.    Tobacco use disorder.  Smokes cigarettes on some days.  Denies cough shortness of breath wheezing  last Pap smear was 4 years ago but she is requesting for a Pap smear today .  She stated that her last period lasted 9 days, the bleeding was heavier and she had bad menstrual cramps.  She has been seen her menses monthly.    Most recent fall risk assessment:    07/19/2021    3:14 PM  Fall Risk   Falls in the past year? 0  Number falls in past yr: 0  Injury with Fall? 0  Risk for fall due to : No Fall Risks  Follow up Falls evaluation completed     Most recent depression screenings:    03/10/2023    3:24 PM 07/19/2021    3:14 PM  PHQ 2/9 Scores  PHQ - 2 Score 2 0  PHQ- 9 Score 13         Patient Care Team: Arthella Headings R, FNP as PCP - General (Nurse Practitioner)   Outpatient Medications Prior to Visit  Medication Sig   acetaminophen  (TYLENOL ) 500  MG tablet Take 500 mg by mouth every 6 (six) hours as needed.   Multiple Vitamins-Minerals (HAIR SKIN AND NAILS FORMULA PO) Take by mouth.   amLODipine  (NORVASC ) 10 MG tablet Take 1 tablet (10 mg total) by mouth daily. (Patient not taking: Reported on 03/10/2023)   losartan -hydrochlorothiazide (HYZAAR) 100-25 MG tablet Take 1 tablet by mouth daily. (Patient not taking: Reported on 03/10/2023)   No facility-administered medications prior to visit.    Review of Systems  Constitutional:  Negative for appetite change, chills, fatigue and fever.  HENT:  Negative for congestion, postnasal drip, rhinorrhea and sneezing.   Eyes:  Negative for discharge and itching.  Respiratory:  Negative for cough, shortness of breath and wheezing.   Cardiovascular:  Negative for chest pain, palpitations and leg swelling.  Gastrointestinal:  Negative for abdominal pain, constipation, nausea and vomiting.  Endocrine: Negative for cold intolerance, heat intolerance and polydipsia.  Genitourinary:  Positive for menstrual problem. Negative for difficulty urinating, dysuria, flank pain and frequency.  Musculoskeletal:  Negative for arthralgias, back pain, joint swelling and myalgias.  Skin:  Negative for color change, pallor, rash and wound.  Neurological:  Negative for dizziness, facial asymmetry, weakness, numbness and headaches.  Psychiatric/Behavioral:  Positive for  sleep disturbance. Negative for behavioral problems, confusion, self-injury and suicidal ideas.        Objective:     BP (!) 185/85   Pulse (!) 57   Temp (!) 97.1 F (36.2 C)   Wt 174 lb (78.9 kg)   LMP 02/16/2023   SpO2 99%   BMI 33.98 kg/m    Physical Exam Vitals and nursing note reviewed. Exam conducted with a chaperone present.  Constitutional:      General: She is not in acute distress.    Appearance: Normal appearance. She is obese. She is not ill-appearing, toxic-appearing or diaphoretic.  HENT:     Right Ear: Tympanic membrane,  ear canal and external ear normal. There is impacted cerumen.     Left Ear: Tympanic membrane, ear canal and external ear normal. There is no impacted cerumen.     Nose: Nose normal. No congestion or rhinorrhea.     Mouth/Throat:     Mouth: Mucous membranes are moist.     Pharynx: Oropharynx is clear. No oropharyngeal exudate or posterior oropharyngeal erythema.  Eyes:     General: No scleral icterus.       Right eye: No discharge.        Left eye: No discharge.     Extraocular Movements: Extraocular movements intact.     Conjunctiva/sclera: Conjunctivae normal.  Neck:     Vascular: No carotid bruit.  Cardiovascular:     Rate and Rhythm: Normal rate and regular rhythm.     Pulses: Normal pulses.     Heart sounds: Normal heart sounds. No murmur heard.    No friction rub. No gallop.  Pulmonary:     Effort: Pulmonary effort is normal. No respiratory distress.     Breath sounds: Normal breath sounds. No stridor. No wheezing, rhonchi or rales.  Chest:     Chest wall: No mass, lacerations, deformity, swelling, tenderness or edema.  Breasts:    Tanner Score is 5.     Breasts are symmetrical.     Right: Normal. No swelling, bleeding, inverted nipple, mass, nipple discharge, skin change or tenderness.     Left: Normal. No swelling, bleeding, inverted nipple, mass, nipple discharge, skin change or tenderness.  Abdominal:     General: Bowel sounds are normal. There is no distension.     Palpations: Abdomen is soft. There is no mass.     Tenderness: There is no abdominal tenderness. There is no right CVA tenderness, left CVA tenderness, guarding or rebound.     Hernia: No hernia is present. There is no hernia in the left inguinal area or right inguinal area.  Genitourinary:    General: Normal vulva.     Exam position: Lithotomy position.     Pubic Area: No rash or pubic lice.      Tanner stage (genital): 5.     Labia:        Right: No rash, tenderness, lesion or injury.        Left: No  rash, tenderness, lesion or injury.      Urethra: No prolapse, urethral pain, urethral swelling or urethral lesion.     Vagina: No signs of injury and foreign body. No vaginal discharge, erythema, tenderness, bleeding, lesions or prolapsed vaginal walls.     Cervix: No cervical motion tenderness, discharge, friability, lesion, erythema, cervical bleeding or eversion.     Uterus: Normal. Not enlarged, not fixed, not tender and no uterine prolapse.      Adnexa:  Right: No mass, tenderness or fullness.         Left: No mass, tenderness or fullness.    Musculoskeletal:        General: No swelling, tenderness, deformity or signs of injury.     Cervical back: Normal range of motion and neck supple. No rigidity or tenderness.     Right lower leg: No edema.     Left lower leg: No edema.  Lymphadenopathy:     Cervical: No cervical adenopathy.     Upper Body:     Right upper body: No supraclavicular, axillary or pectoral adenopathy.     Left upper body: No supraclavicular, axillary or pectoral adenopathy.     Lower Body: No right inguinal adenopathy. No left inguinal adenopathy.  Skin:    General: Skin is warm and dry.     Capillary Refill: Capillary refill takes less than 2 seconds.     Coloration: Skin is not jaundiced or pale.     Findings: No bruising, erythema, lesion or rash.  Neurological:     Mental Status: She is alert and oriented to person, place, and time.     Cranial Nerves: No cranial nerve deficit.     Sensory: No sensory deficit.     Motor: No weakness.     Coordination: Coordination normal.     Gait: Gait normal.     Deep Tendon Reflexes: Reflexes normal.  Psychiatric:        Mood and Affect: Mood normal.        Behavior: Behavior normal.        Thought Content: Thought content normal.        Judgment: Judgment normal.     No results found for any visits on 03/10/23.     Assessment & Plan:    Routine Health Maintenance and Physical Exam  Immunization  History  Administered Date(s) Administered   Tdap 03/06/2016    Health Maintenance  Topic Date Due   Pneumococcal Vaccine 100-75 Years old (1 of 2 - PCV) Never done   Colonoscopy  Never done   COVID-19 Vaccine (1 - 2024-25 season) Never done   INFLUENZA VACCINE  05/25/2023 (Originally 09/25/2022)   Cervical Cancer Screening (HPV/Pap Cotest)  06/14/2024   DTaP/Tdap/Td (2 - Td or Tdap) 03/06/2026   Hepatitis C Screening  Completed   HIV Screening  Completed   HPV VACCINES  Aged Out    Discussed health benefits of physical activity, and encouraged her to engage in regular exercise appropriate for her age and condition.  Problem List Items Addressed This Visit       Cardiovascular and Mediastinum   Hypertension   BP Readings from Last 3 Encounters:  03/10/23 (!) 185/85  07/19/21 (!) 166/87  06/14/21 (!) 158/99   unControlled .  Patient is not interested in restarting her medication Need to take medication to help prevent kidney disease, stroke, heart attack discussed but the patient refused medication I encouraged the patient to monitor her blood pressure at home keep a log and bring to next visit. Patient instructed to go to the emergency room right away if she develops chest pain, shortness of breath, headache,  Discussed DASH diet and dietary sodium restrictions Continue to increase dietary efforts and exercise.  Checking CMP          Other   Vitamin D  deficiency   Relevant Orders   VITAMIN D  25 Hydroxy (Vit-D Deficiency, Fractures)   Major depressive disorder with single episode, in  partial remission [facility])   Flowsheet Row Office Visit from 03/10/2023 in [facility]  PHQ-9 Total Score 13     Currently not on medication She denies SI, HI Will follow-up on this at next visit      Tobacco use disorder   Need to quit smoking cigarettes discussed      Screening for cervical cancer   Pap smear completed  without difficulty Sample sent to the lab for testing      Relevant Orders   Cytology - PAP([place])   Insomnia - Primary   Epworth sleepiness scale was 15 Sleep hygiene discussed We discussed starting medication for insomnia but the patient states that she will think about this Home sleep study ordered       Relevant Orders   Home sleep test   Annual physical exam   Annual exam as documented.  Counseling done include healthy lifestyle involving committing to 150 minutes of exercise per week, heart healthy diet, and attaining healthy weight. The importance of adequate sleep also discussed.   Changes in health habits are decided on by patient with goals and time frames set for achieving them. Immunization and cancer screening  needs are specifically addressed at this visit.   Up-to-date with mammogram up-to-date with cervical cancer screening but she would like a Pap smear done today. She declined flu vaccine patient encouraged to consider getting the vaccine Referral sent for colonoscopy      Prediabetes   Relevant Orders   Hemoglobin A1c   Other Visit Diagnoses       Screening for colon cancer       Relevant Orders   Ambulatory referral to Gastroenterology     Screening for endocrine, nutritional, metabolic and immunity disorder       Relevant Orders   CBC   CMP14+EGFR   Hemoglobin A1c   Lipid panel     Excessive ear wax, right       Relevant Medications   carbamide peroxide (DEBROX) 6.5 % OTIC solution      Return in about 3 months (around 06/08/2023) for HTN.     Maddix Kliewer R Prezley Qadir, FNP

## 2023-03-10 NOTE — Assessment & Plan Note (Signed)
 Epworth sleepiness scale was 15 Sleep hygiene discussed We discussed starting medication for insomnia but the patient states that she will think about this Home sleep study ordered

## 2023-03-10 NOTE — Patient Instructions (Addendum)
 I encourage you  to consider taking your blood pressure medications to prevent stroke, heart attack or kidney damage.  Blood pressure goal is less than 140/90    Around 3 times per week, check your blood pressure 2 times per day. once in the morning and once in the evening. The readings should be at least one minute apart. Write down these values and bring them to your next nurse visit/appointment.  When you check your BP, make sure you have been doing something calm/relaxing 5 minutes prior to checking. Both feet should be flat on the floor and you should be sitting. Use your left arm and make sure it is in a relaxed position (on a table), and that the cuff is at the approximate level/height of your heart.    It is important that you exercise regularly at least 30 minutes 5 times a week as tolerated  Think about what you will eat, plan ahead. Choose  clean, green, fresh or frozen over canned, processed or packaged foods which are more sugary, salty and fatty. 70 to 75% of food eaten should be vegetables and fruit. Three meals at set times with snacks allowed between meals, but they must be fruit or vegetables. Aim to eat over a 12 hour period , example 7 am to 7 pm, and STOP after  your last meal of the day. Drink water,generally about 64 ounces per day, no other drink is as healthy. Fruit juice is best enjoyed in a healthy way, by EATING the fruit.  Thanks for choosing [facility] we consider it a privelige to serve you.

## 2023-03-10 NOTE — Assessment & Plan Note (Signed)
 Pap smear completed without difficulty Sample sent to the lab for testing

## 2023-03-10 NOTE — Assessment & Plan Note (Signed)
 Need to quit smoking cigarettes discussed

## 2023-03-11 ENCOUNTER — Other Ambulatory Visit: Payer: Commercial Managed Care - PPO

## 2023-03-11 DIAGNOSIS — E559 Vitamin D deficiency, unspecified: Secondary | ICD-10-CM

## 2023-03-11 DIAGNOSIS — Z130 Encounter for screening for diseases of the blood and blood-forming organs and certain disorders involving the immune mechanism: Secondary | ICD-10-CM

## 2023-03-11 DIAGNOSIS — R7303 Prediabetes: Secondary | ICD-10-CM

## 2023-03-12 LAB — CMP14+EGFR
ALT: 13 [IU]/L (ref 0–32)
AST: 22 [IU]/L (ref 0–40)
Albumin: 4.4 g/dL (ref 3.9–4.9)
Alkaline Phosphatase: 63 [IU]/L (ref 44–121)
BUN/Creatinine Ratio: 9 (ref 9–23)
BUN: 9 mg/dL (ref 6–24)
Bilirubin Total: 0.4 mg/dL (ref 0.0–1.2)
CO2: 19 mmol/L — ABNORMAL LOW (ref 20–29)
Calcium: 9.4 mg/dL (ref 8.7–10.2)
Chloride: 106 mmol/L (ref 96–106)
Creatinine, Ser: 0.97 mg/dL (ref 0.57–1.00)
Globulin, Total: 2.2 g/dL (ref 1.5–4.5)
Glucose: 84 mg/dL (ref 70–99)
Potassium: 4.5 mmol/L (ref 3.5–5.2)
Sodium: 142 mmol/L (ref 134–144)
Total Protein: 6.6 g/dL (ref 6.0–8.5)
eGFR: 72 mL/min/{1.73_m2} (ref >59)

## 2023-03-12 LAB — LIPID PANEL
Chol/HDL Ratio: 2.1 {ratio} (ref 0.0–4.4)
Cholesterol, Total: 128 mg/dL (ref 100–199)
HDL: 62 mg/dL (ref >39)
LDL Chol Calc (NIH): 47 mg/dL (ref 0–99)
Triglycerides: 104 mg/dL (ref 0–149)
VLDL Cholesterol Cal: 19 mg/dL (ref 5–40)

## 2023-03-12 LAB — CBC
Hematocrit: 41.2 % (ref 34.0–46.6)
Hemoglobin: 12.9 g/dL (ref 11.1–15.9)
MCH: 28.7 pg (ref 26.6–33.0)
MCHC: 31.3 g/dL — ABNORMAL LOW (ref 31.5–35.7)
MCV: 92 fL (ref 79–97)
Platelets: 341 10*3/uL (ref 150–450)
RBC: 4.49 x10E6/uL (ref 3.77–5.28)
RDW: 13.1 % (ref 11.7–15.4)
WBC: 7.6 10*3/uL (ref 3.4–10.8)

## 2023-03-12 LAB — HEMOGLOBIN A1C
Est. average glucose Bld gHb Est-mCnc: 123 mg/dL
Hgb A1c MFr Bld: 5.9 % — ABNORMAL HIGH (ref 4.8–5.6)

## 2023-03-12 LAB — VITAMIN D 25 HYDROXY (VIT D DEFICIENCY, FRACTURES): Vit D, 25-Hydroxy: 12.2 ng/mL — ABNORMAL LOW (ref 30.0–100.0)

## 2023-03-31 ENCOUNTER — Other Ambulatory Visit (HOSPITAL_COMMUNITY)
Admission: RE | Admit: 2023-03-31 | Discharge: 2023-03-31 | Disposition: A | Discharge status: Home / Self Care | Payer: Commercial Managed Care - PPO | Source: Ambulatory Visit | Attending: Nurse Practitioner | Admitting: Nurse Practitioner

## 2023-03-31 ENCOUNTER — Telehealth: Payer: Self-pay

## 2023-03-31 ENCOUNTER — Other Ambulatory Visit: Payer: Self-pay | Admitting: Nurse Practitioner

## 2023-03-31 DIAGNOSIS — Z124 Encounter for screening for malignant neoplasm of cervix: Secondary | ICD-10-CM | POA: Diagnosis present

## 2023-03-31 LAB — HM PAP SMEAR

## 2023-03-31 LAB — RESULTS CONSOLE HPV: CHL HPV: NEGATIVE

## 2023-03-31 NOTE — Telephone Encounter (Signed)
 Copied from CRM [id number]. Topic: Clinical - Lab/Test Results >> Mar 31, 2023  3:51 PM Gaetano Hawthorne wrote: Reason for CRM: Patient is calling back regarding her lab results, she missed a call - Contact Center Agent noted that Charice called her earlier today.

## 2023-03-31 NOTE — Telephone Encounter (Signed)
 Copied from CRM [id number]. Topic: Clinical - Request for Lab/Test Order >> Mar 31, 2023 10:30 AM Shelah Lewandowsky wrote: Reason for CRM: [facility] need clarification on order for PAP test please call [phone]

## 2023-04-02 LAB — CYTOLOGY - PAP
Comment: NEGATIVE
Diagnosis: NEGATIVE
High risk HPV: NEGATIVE

## 2023-04-07 ENCOUNTER — Telehealth: Payer: Self-pay | Admitting: Nurse Practitioner

## 2023-04-07 NOTE — Telephone Encounter (Signed)
Letter sent to My[facility] with Referral info on it for [facility]

## 2023-04-16 ENCOUNTER — Ambulatory Visit (AMBULATORY_SURGERY_CENTER): Payer: Commercial Managed Care - PPO

## 2023-04-16 VITALS — Ht 60.0 in | Wt 173.0 lb

## 2023-04-16 DIAGNOSIS — Z1211 Encounter for screening for malignant neoplasm of colon: Secondary | ICD-10-CM

## 2023-04-16 MED ORDER — SUFLAVE 178.7 G PO SOLR: 1.0000 | Freq: Once | ORAL | 0 refills | Status: AC

## 2023-04-16 NOTE — Progress Notes (Signed)

## 2023-04-21 ENCOUNTER — Ambulatory Visit (HOSPITAL_BASED_OUTPATIENT_CLINIC_OR_DEPARTMENT_OTHER): Payer: Commercial Managed Care - PPO | Attending: Nurse Practitioner | Admitting: Internal Medicine

## 2023-04-21 DIAGNOSIS — G4700 Insomnia, unspecified: Secondary | ICD-10-CM | POA: Diagnosis present

## 2023-04-30 ENCOUNTER — Encounter: Payer: Self-pay | Admitting: Pediatrics

## 2023-05-01 ENCOUNTER — Telehealth: Payer: Self-pay

## 2023-05-01 ENCOUNTER — Ambulatory Visit: Payer: Self-pay | Admitting: Nurse Practitioner

## 2023-05-01 ENCOUNTER — Ambulatory Visit (HOSPITAL_COMMUNITY)
Admission: RE | Admit: 2023-05-01 | Discharge: 2023-05-01 | Disposition: A | Discharge status: Home / Self Care | Source: Ambulatory Visit | Attending: Nurse Practitioner | Admitting: Nurse Practitioner

## 2023-05-01 ENCOUNTER — Encounter: Payer: Self-pay | Admitting: Nurse Practitioner

## 2023-05-01 ENCOUNTER — Telehealth: Payer: Self-pay | Admitting: Nurse Practitioner

## 2023-05-01 VITALS — BP 184/88 | HR 109 | Temp 97.1°F | Wt 174.0 lb

## 2023-05-01 DIAGNOSIS — I10 Essential (primary) hypertension: Secondary | ICD-10-CM

## 2023-05-01 DIAGNOSIS — F4321 Adjustment disorder with depressed mood: Secondary | ICD-10-CM

## 2023-05-01 DIAGNOSIS — M545 Low back pain, unspecified: Secondary | ICD-10-CM | POA: Insufficient documentation

## 2023-05-01 DIAGNOSIS — F331 Major depressive disorder, recurrent, moderate: Secondary | ICD-10-CM

## 2023-05-01 DIAGNOSIS — F32A Depression, unspecified: Secondary | ICD-10-CM | POA: Insufficient documentation

## 2023-05-01 NOTE — Telephone Encounter (Signed)
 Reason for CRM: Radiology called about order for patient. Patient is currently in for appt and caller wanted to make sure the correct test was ordered. States they received cervical but patient is complaining of lower back pain. Would like a call back to confirm before she does any test. Call back: [phone]

## 2023-05-01 NOTE — Assessment & Plan Note (Signed)
 Patient referred for grief counseling

## 2023-05-01 NOTE — Assessment & Plan Note (Signed)
 BP Readings from Last 3 Encounters:  05/01/23 (!) 184/88  03/10/23 (!) 185/85  07/19/21 (!) 166/87  Still not interested in taking medications Patient encouraged to consider taking medications to prevent risk of kidney disease, stroke,, heart attack Patient is currently asymptomatic

## 2023-05-01 NOTE — Assessment & Plan Note (Addendum)
    05/01/2023   11:12 AM 03/10/2023    3:24 PM 07/19/2021    3:14 PM  Depression screen PHQ 2/9  Decreased Interest 3 1 0  Down, Depressed, Hopeless 3 1 0  PHQ - 2 Score 6 2 0  Altered sleeping 1 3   Tired, decreased energy 1 3   Change in appetite 0 3   Feeling bad or failure about yourself  0 1   Trouble concentrating 1 1 0  Moving slowly or fidgety/restless 3 0 0  Suicidal thoughts 0 0 0  PHQ-9 Score 12 13   Difficult doing work/chores Somewhat difficult    She is interested in grief counseling Patient referred for counseling

## 2023-05-01 NOTE — Assessment & Plan Note (Deleted)
    05/01/2023   11:12 AM 03/10/2023    3:24 PM 07/19/2021    3:14 PM  Depression screen PHQ 2/9  Decreased Interest 3 1 0  Down, Depressed, Hopeless 3 1 0  PHQ - 2 Score 6 2 0  Altered sleeping 1 3   Tired, decreased energy 1 3   Change in appetite 0 3   Feeling bad or failure about yourself  0 1   Trouble concentrating 1 1 0  Moving slowly or fidgety/restless 3 0 0  Suicidal thoughts 0 0 0  PHQ-9 Score 12 13   Difficult doing work/chores Somewhat difficult

## 2023-05-01 NOTE — Assessment & Plan Note (Addendum)
 Patient encouraged to take Tylenol 650 mg every 6 hours as needed, application of heat and ice encouraged.  Patient told to avoid ibuprofen due to her uncontrolled hypertension  - DG Lumbar Spine Complete; Future

## 2023-05-01 NOTE — Telephone Encounter (Signed)
 Called back to imaging. KH

## 2023-05-01 NOTE — Patient Instructions (Addendum)
 Hypertension, unspecified type (Primary)     Acute right-sided low back pain without sciatica  - DG Cervical Spine Complete; Future It is okay to take Tylenol 650 mg every 6 hours as needed, stretching exercises and application of heating pad or ice is encouraged.   Anxiety and depression  - AMB Referral [facility] Care Management   .Behavioral Health Resources:    What if I or someone I know is in crisis?   If you are thinking about harming yourself or having thoughts of suicide, or if you know someone who is, seek help right away.   Call your doctor or mental health care provider.   Call 911 or go to a hospital emergency room to get immediate help, or ask a friend or family member to help you do these things; IF YOU ARE IN [place], YOU MAY GO TO WALK-IN URGENT CARE 24/7 at [facility] (see below)   Call the [place] National Suicide Prevention Lifeline's toll-free, 24-hour hotline at 1-800-273-TALK 601-215-1142) or TTY: 1-800-799-4 TTY 606-851-4003) to talk to a trained counselor.   If you are in crisis, make sure you are not left alone.    If someone else is in crisis, make sure he or she is not left alone     24 Hour :    [facility]  [address], [place], [place] [address] [phone] or [phone] WALK-IN URGENT CARE 24/7   [facility] Mobile Crisis: (315)833-1944   [place] National Suicide Hotline: (830) 648-8596   [facility] [employer] (Domestic Violence, Rape & Victim Assistance)  (985)140-5085   [facility]  [address], [place]  [address]   [phone] or [phone]    [facility] [employer] [facility]: [phone] (8am-4pm) or [phone] (after hours)          [facility], [address], [place], [place]  [phone] Fax: [phone] [email or web address] *Interpreters  available *Accepts all insurance and uninsured for Urgent Care needs *Accepts Medicaid and uninsured for outpatient treatment    [facility]   Mon-Fri: 8am-5pm [address], [place], [place] [phone](HOZYY); [phone](fax) [email or web address]  *Accepts Medicare   [facility] [place], Fri: 8am-4pm [address], [place], [place] [address] [phone] (phone); [phone] (fax) ExShows.[email or web address]  *Accepts Medicare   [facility] Mon-Fri: 9am-5pm  [address] [place], [place] [phone] (phone); [phone]  [email or web address]  *Accepts Medicaid   [facility], 8:30am-12pm/1pm-2:30pm [address], [place], [place] [phone] (phone); [phone] (fax) www.[email or web address]  *Accepts Medicaid, sliding-scale*Bilingual services available   [facility] Mon-Fri, 8am-7pm [address], [place], [place]  [phone](MEBRA); [phone](fax) www.[email or web address]  *Accepts Medicaid *Bilingual services available   [facility] Mon-Fri: 8am-5pm, Saturday by appointment only [address] [place], [place], [place] [phone] (phone); [phone] (fax) [email or web address]    [facility] [address], [place], [place] [phone] [email or web address]  *Free & reduced services for uninsured and underinsured individuals *Bilingual services for Spanish-speaking clients 21 and under   [facility], [address], [place], [place] [phone](VANVB); [phone](fax) KittenExchange.[email or web address]  *Bring your own interpreter at first visit *Accepts Medicare and [facility]   [facility] Mon-Fri: 9am-5:30pm [address], [place], [place] [phone]  (phone), [phone] (fax) After hours crisis line: [phone] [email or web address]  *Accepts Medicare and Medicaid   [facility]  Counseling Mon-Thurs, 8am-6pm [address], [place], [place]  [phone] (phone); [phone] (fax) http://[email or web address]  *Subsidized costs available   [facility] Mon-Fri: 8am-4pm [address], [place], [place] [phone](MWNUU); [phone](fax) www.[email or web address]  *Accepts Medicaid   [facility] Same day access hours: Mon-Fri, 8:30-3pm Crisis hours: Mon-Fri, 8am-5pm [address] [place], [place] [phone]   [facility] Same day access hours: Mon-Fri, 8:30-3pm Crisis hours: Mon-Fri, 8am-8pm [address], [place], [place] [phone] (phone); [phone] (fax) www.[email or web address]  *Accepts Medicaid and Medicare   [facility], [place], Fri: 9am-9pm Tues, Thurs: 9am-6pm [address] [place], [place], [place]  [phone] (phone); [phone] (fax) [email or web address]  *(Accepts Medicare and Medicaid; payment plans available)*Bilingual services available   [facility] [address] [place], [place] [phone] (phone); [phone] (fax) [email or web address]    [facility] [address], [place], [place]  [phone]  [email or web address]  *Bilingual services available   [facility]) Mon-Thurs: 8am-8pm [address], [place], [place] [phone] (phone); [phone] (fax) [email or web address]  *Accepts Medicaid*Bilingual services available   [facility] [address] [place], [place] [phone] (phone) [email or web address]  *Accepts Medicaid *Bilingual services available   [facility] Mon-Fri, 9am-4:45pm [address] [place],  [place] [phone] (phone); [phone] (fax) [email or web address]  *Accepts Medicare   [facility] Mon-Thurs: 8:30-8pm, Fri: 8:30am-7pm [address] [place], [place] (3rd floor) [phone] (phone); [phone] (fax) [email or web address]  *Accepts Medicaid; income-based reduced rates available   [facility] Mon-Fri: 8am-5pm [address], [place], [place] [address] [phone] (phone); [phone] (fax) [email or web address]://[email or web address]  *Accepts Medicaid*Bilingual services available     [facility] [place])  [address] [place] [phone] www.[email or web address]  *Provides direct services to individuals in recovery from mental illness, including support groups, recovery skills classes, and one on one peer support   [employer] on Mental Illness) [place] helpline: 941-177-0531  [facility] [place] helpline: 272-278-1811 https://[email or web address]  *A community hub for information relating to local resources and services for the friends and families of individuals living alongside a mental health condition, as well as the individuals themselves. Classes and support groups also provided       It is important that you exercise regularly at least 30 minutes 5 times a week as tolerated  Think about what you will eat, plan ahead. Choose " clean, green, fresh or frozen" over canned, processed or packaged foods which are more sugary, salty and fatty. 70 to 75% of food eaten should be vegetables and fruit. Three meals at set times with snacks allowed between meals, but they must be fruit or vegetables. Aim to eat over a 12 hour period , example 7 am to 7 pm, and STOP after  your last meal of the day. Drink water,generally about 64 ounces per day, no other drink is as healthy. Fruit juice is best enjoyed in a healthy way, by EATING the fruit.  Thanks for choosing [facility] we consider  it a privelige to serve you.

## 2023-05-01 NOTE — Progress Notes (Signed)
 Acute Office Visit  Subjective:     Patient ID: Melanie Shaw, female    DOB: 13-Feb-1976, 48 y.o.   MRN: [id number]  Chief Complaint  Patient presents with   Back Pain    For a month  going around to the abd    HPI [name]  has a past medical history of Headache, Hypertension, Numbness and tingling of right arm (12/2018), PTSD (post-traumatic stress disorder), and Vitamin D deficiency.   Acute low back pain .patient presents with complaints of right-sided low back pain that started since her last visit in January, her pain improves when she is active , also takes ibuprofen and Tylenol that helps relieve her pain.  Pain is currently a 3/10.  Patient denies urinary or bladder incontinence, trauma    Depression.  States that her aunt is fighting cancer and she does not have much time here anymore states that she is very close to this aunt.  Currently denies SI, HI.  We discussed referral for grief counseling.  Hypertension.  Currently not on medication, still not interested in starting medication.  No headache, dizziness, edema   Has upcoming colonoscopy next week     Review of Systems  Constitutional:  Negative for appetite change, chills, fatigue and fever.  HENT:  Negative for congestion, postnasal drip, rhinorrhea and sneezing.   Respiratory:  Negative for cough, shortness of breath and wheezing.   Cardiovascular:  Negative for chest pain, palpitations and leg swelling.  Gastrointestinal:  Negative for abdominal pain, constipation, nausea and vomiting.  Genitourinary:  Negative for difficulty urinating, dysuria, flank pain and frequency.  Musculoskeletal:  Positive for back pain. Negative for joint swelling and myalgias.  Skin:  Negative for color change, pallor, rash and wound.  Neurological:  Negative for dizziness, facial asymmetry, weakness, numbness and headaches.  Psychiatric/Behavioral:  Negative for behavioral problems, confusion, self-injury and  suicidal ideas.         Objective:    BP (!) 184/88   Pulse (!) 109   Temp (!) 97.1 F (36.2 C)   Wt 174 lb (78.9 kg)   SpO2 94%   BMI 33.98 kg/m    Physical Exam Vitals and nursing note reviewed.  Constitutional:      General: She is not in acute distress.    Appearance: Normal appearance. She is not ill-appearing, toxic-appearing or diaphoretic.  HENT:     Mouth/Throat:     Mouth: Mucous membranes are moist.     Pharynx: Oropharynx is clear. No oropharyngeal exudate or posterior oropharyngeal erythema.  Eyes:     General: No scleral icterus.       Right eye: No discharge.        Left eye: No discharge.     Extraocular Movements: Extraocular movements intact.     Conjunctiva/sclera: Conjunctivae normal.  Cardiovascular:     Rate and Rhythm: Normal rate and regular rhythm.     Pulses: Normal pulses.     Heart sounds: Normal heart sounds. No murmur heard.    No friction rub. No gallop.  Pulmonary:     Effort: Pulmonary effort is normal. No respiratory distress.     Breath sounds: Normal breath sounds. No stridor. No wheezing, rhonchi or rales.  Chest:     Chest wall: No tenderness.  Abdominal:     General: There is no distension.     Palpations: Abdomen is soft.     Tenderness: There is no abdominal tenderness. There is no right  CVA tenderness, left CVA tenderness or guarding.  Musculoskeletal:        General: Tenderness present. No swelling, deformity or signs of injury.     Right lower leg: No edema.     Left lower leg: No edema.     Comments: Tenderness ON palpation of low back area on the right side, skin warm and dry no redness or swelling noted   Skin:    General: Skin is warm and dry.     Capillary Refill: Capillary refill takes less than 2 seconds.     Coloration: Skin is not jaundiced or pale.     Findings: No bruising, erythema or lesion.  Neurological:     Mental Status: She is alert and oriented to person, place, and time.     Motor: No weakness.      Coordination: Coordination normal.     Gait: Gait normal.  Psychiatric:        Mood and Affect: Mood normal.        Behavior: Behavior normal.        Thought Content: Thought content normal.        Judgment: Judgment normal.     No results found for any visits on 05/01/23.      Assessment & Plan:   Problem List Items Addressed This Visit       Cardiovascular and Mediastinum   Hypertension   BP Readings from Last 3 Encounters:  05/01/23 (!) 184/88  03/10/23 (!) 185/85  07/19/21 (!) 166/87  Still not interested in taking medications Patient encouraged to consider taking medications to prevent risk of kidney disease, stroke,, heart attack Patient is currently asymptomatic        Other   Grieving   Patient referred for grief counseling      Relevant Orders   AMB Referral VBCI Care Management   Major depressive disorder, recurrent episode, moderate degree (HCC)      05/01/2023   11:12 AM 03/10/2023    3:24 PM 07/19/2021    3:14 PM  Depression screen PHQ 2/9  Decreased Interest 3 1 0  Down, Depressed, Hopeless 3 1 0  PHQ - 2 Score 6 2 0  Altered sleeping 1 3   Tired, decreased energy 1 3   Change in appetite 0 3   Feeling bad or failure about yourself  0 1   Trouble concentrating 1 1 0  Moving slowly or fidgety/restless 3 0 0  Suicidal thoughts 0 0 0  PHQ-9 Score 12 13   Difficult doing work/chores Somewhat difficult    She is interested in grief counseling Patient referred for counseling      Relevant Orders   AMB Referral VBCI Care Management   Tobacco use disorder   Acute right-sided low back pain without sciatica - Primary   Patient encouraged to take Tylenol 650 mg every 6 hours as needed, application of heat and ice encouraged.  Patient told to avoid ibuprofen due to her uncontrolled hypertension  - DG Lumbar Spine Complete; Future       Relevant Orders   DG Lumbar Spine Complete    No orders of the defined types were placed in this  encounter.   No follow-ups on file.  Donell Beers, FNP

## 2023-05-02 DIAGNOSIS — G4700 Insomnia, unspecified: Secondary | ICD-10-CM | POA: Diagnosis not present

## 2023-05-02 NOTE — Procedures (Signed)
    [facility] [address] [place], [place] [address] Tel: [phone]   Fax: [phone]  Home Sleep Test Interpretation  Patient Name:  Melanie Shaw, Melanie Shaw Date:  04/21/2023 Referring Physician:  Donell Beers, FN-P  Indications for Polysomnography The patient is a 48 year-old Female who is 5\' 3"  and weighs 173.0 lbs. Her BMI equals 30.7.  A home sleep apnea test was performed to evaluate for snoring.  Polysomnogram Data A home sleep test recorded the standard physiologic parameters including EKG, nasal and oral airflow.  Respiratory parameters of chest and abdominal movements were recorded with Respiratory Inductance Plethysmography belts.  Oxygen saturation was recorded by pulse oximetry.   Study Architecture The total recording time of the polysomnogram was 375.0 minutes.  The total monitoring time was 375.5 minutes.  Time spent in Supine position was 29.0 minutes.   Respiratory Events The study revealed a presence of 1 obstructive, 2 central, and - mixed apneas resulting in an Apnea index of 0.5 events per hour.  There were 32 hypopneas (>=3% desaturation and/or arousal) resulting in an Apnea\Hypopnea Index (AHI >=3% desaturation and/or arousal) of 5.6 events per hour.  There were 12 hypopneas (>=4% desaturation) resulting in an Apnea\Hypopnea Index (AHI >=4% desaturation) of 2.4 events per hour.  There were 1 Respiratory Effort Related Arousals resulting in a RERA index of 0.2 events per hour. The Respiratory Disturbance Index is 5.8 events per hour.  The snore index was 3.5 events per hour.  Mean oxygen saturation was 97.7%.  The lowest oxygen saturation during monitoring time was 88.0%.  Time spent <=88% oxygen saturation was 0.1 minutes (-).  Cardiac Summary The average pulse rate was 74.6 bpm.  The minimum pulse rate was 55.0 bpm while the maximum pulse rate was 93.0 bpm.  Cardiac rhythm was normal/abnormal.  Comment:  Occasional apneas and hypopneas, within normal limits, AHI (4%) 2.4/hr. Snoring with oxygen desaturation to a nadir of 88%, mean 97.7%.  Diagnosis:  Normal study  Recommendations: Manage for symptoms based on clinical judgment.   This study was personally reviewed and electronically signed by: Jetty Duhamel, MD Accredited Board Certified in Sleep Medicine Date/Time: 05/02/23   1:45 PM                         Jetty Duhamel Diplomate, American Board of Sleep Medicine  ELECTRONICALLY SIGNED ON:  05/02/2023, 12:52 PM [facility] PH: [phone]   FX: [phone] ACCREDITED BY THE AMERICAN ACADEMY OF SLEEP MEDICINE

## 2023-05-04 ENCOUNTER — Telehealth: Payer: Self-pay | Admitting: *Deleted

## 2023-05-04 NOTE — Progress Notes (Unsigned)
 Complex Care Management Note Care Guide Note  05/04/2023 Name: Melanie Shaw MRN: [id number] DOB: May 20, 1975   Complex Care Management Outreach Attempts: An unsuccessful telephone outreach was attempted today to offer the patient information about available complex care management services.  Follow Up Plan:  Additional outreach attempts will be made to offer the patient complex care management information and services.   Encounter Outcome:  No Answer  Burman Nieves, CMA, Care Guide [facility]  [facility], [facility] Guide Direct Dial: [phone]  Fax: [phone] Website: [email or web address]

## 2023-05-05 NOTE — Progress Notes (Unsigned)
 [facility] History and Physical   Primary Care Physician:  Melanie Beers, FNP   Reason for Procedure:  Colon cancer screening  Plan:    Screening colonoscopy  HPI: Melanie Shaw is a 48 y.o. female undergoing screening colonoscopy for colon cancer screening.  No previous colonoscopy.  No family history of colorectal cancer polyps.  Patient denies current symptoms of change in bowel habits or rectal bleeding.   Past Medical History:  Diagnosis Date   Headache    Hypertension    Numbness and tingling of right arm 12/2018   PTSD (post-traumatic stress disorder)    Vitamin D deficiency     Past Surgical History:  Procedure Laterality Date   ANKLE SURGERY     CESAREAN SECTION     MULTIPLE TOOTH EXTRACTIONS      Prior to Admission medications   Medication Sig Start Date End Date Taking? Authorizing Provider  acetaminophen (TYLENOL) 500 MG tablet Take 500 mg by mouth every 6 (six) hours as needed. Patient not taking: Reported on 05/01/2023    [provider]  amLODipine (NORVASC) 10 MG tablet Take 1 tablet (10 mg total) by mouth daily. Patient not taking: Reported on 05/01/2023 07/19/21 01/15/22  Melanie Crook I, NP  BIOTIN 5000 PO Take by mouth. Patient not taking: Reported on 05/01/2023    [provider]  carbamide peroxide (DEBROX) 6.5 % OTIC solution Place 5 drops into both ears 2 (two) times daily. Patient not taking: Reported on 05/01/2023 03/10/23   Melanie Beers, FNP  losartan-hydrochlorothiazide (HYZAAR) 100-25 MG tablet Take 1 tablet by mouth daily. Patient not taking: Reported on 05/01/2023 07/19/21 01/15/22  Melanie Crook I, NP  Multiple Vitamins-Minerals (HAIR SKIN AND NAILS FORMULA PO) Take by mouth. Patient not taking: Reported on 05/01/2023    [provider]    Current Outpatient Medications  Medication Sig Dispense Refill   acetaminophen (TYLENOL) 500 MG tablet Take 500 mg by mouth every 6 (six) hours as  needed. (Patient not taking: Reported on 05/01/2023)     amLODipine (NORVASC) 10 MG tablet Take 1 tablet (10 mg total) by mouth daily. (Patient not taking: Reported on 05/01/2023) 90 tablet 1   BIOTIN 5000 PO Take by mouth. (Patient not taking: Reported on 05/01/2023)     carbamide peroxide (DEBROX) 6.5 % OTIC solution Place 5 drops into both ears 2 (two) times daily. (Patient not taking: Reported on 05/01/2023) 15 mL 0   losartan-hydrochlorothiazide (HYZAAR) 100-25 MG tablet Take 1 tablet by mouth daily. (Patient not taking: Reported on 05/01/2023) 90 tablet 1   Multiple Vitamins-Minerals (HAIR SKIN AND NAILS FORMULA PO) Take by mouth. (Patient not taking: Reported on 05/01/2023)     No current facility-administered medications for this visit.    Allergies as of 05/06/2023   (No Known Allergies)    Family History  Problem Relation Age of Onset   Colon polyps Mother    Hypertension Mother    Sleep apnea Mother    Sarcoidosis Mother    Kidney disease Mother    Sleep apnea Father    Breast cancer Paternal Aunt    Stroke Neg Hx    Heart attack Neg Hx    Colon cancer Neg Hx    Esophageal cancer Neg Hx    Rectal cancer Neg Hx    Stomach cancer Neg Hx     Social History   Socioeconomic History   Marital status: Single    Spouse name: Not on file  Number of children: 1   Years of education: Not on file   Highest education level: Not on file  Occupational History   Not on file  Tobacco Use   Smoking status: Some Days    Types: Cigars   Smokeless tobacco: Never  Vaping Use   Vaping status: Never Used  Substance and Sexual Activity   Alcohol use: Yes    Comment: occasionally    Drug use: Not Currently    Types: Marijuana   Sexual activity: Not Currently    Birth control/protection: Condom  Other Topics Concern   Not on file  Social History Narrative   Lives home alone    Social Drivers of Health   Financial Resource Strain: Not on file  Food Insecurity: Not on file   Transportation Needs: Not on file  Physical Activity: Not on file  Stress: Not on file  Social Connections: Not on file  Intimate Partner Violence: Not on file    Review of Systems:  All other review of systems negative except as mentioned in the HPI.  Physical Exam: Vital signs There were no vitals taken for this visit.  General:   Alert,  Well-developed, well-nourished, pleasant and cooperative in NAD Airway:  Mallampati  Lungs:  Clear throughout to auscultation.   Heart:  Regular rate and rhythm; no murmurs, clicks, rubs,  or gallops. Abdomen:  Soft, nontender and nondistended. Normal bowel sounds.   Neuro/Psych:  Normal mood and affect. A and O x 3  [name], MD [facility]

## 2023-05-05 NOTE — Progress Notes (Unsigned)
 Complex Care Management Note Care Guide Note  05/05/2023 Name: Melanie Shaw MRN: [id number] DOB: 04-26-1975   Complex Care Management Outreach Attempts: A second unsuccessful outreach was attempted today to offer the patient with information about available complex care management services.  Follow Up Plan:  Additional outreach attempts will be made to offer the patient complex care management information and services.   Encounter Outcome:  No Answer  Burman Nieves, CMA, Care Guide [facility]  [place], [facility] Guide Direct Dial: [phone]  Fax: [phone] Website: [email or web address]

## 2023-05-06 ENCOUNTER — Encounter: Payer: Self-pay | Admitting: Pediatrics

## 2023-05-06 ENCOUNTER — Ambulatory Visit: Payer: Commercial Managed Care - PPO | Admitting: Pediatrics

## 2023-05-06 VITALS — BP 166/88 | HR 88 | Temp 97.3°F | Resp 12 | Ht 60.0 in | Wt 173.0 lb

## 2023-05-06 DIAGNOSIS — Z1211 Encounter for screening for malignant neoplasm of colon: Secondary | ICD-10-CM

## 2023-05-06 MED ORDER — SODIUM CHLORIDE 0.9 % IV SOLN: 500.0000 mL | Freq: Once | INTRAVENOUS | Status: DC

## 2023-05-06 NOTE — Progress Notes (Signed)
 Sedate, gd SR, tolerated procedure well, VSS, report to RN

## 2023-05-06 NOTE — Patient Instructions (Signed)
-  repeat colonoscopy in 10 years  for surveillance recommended.  -Continue present medications    YOU HAD AN ENDOSCOPIC PROCEDURE TODAY AT THE [facility]:   Refer to the procedure report that was given to you for any specific questions about what was found during the examination.  If the procedure report does not answer your questions, please call your gastroenterologist to clarify.  If you requested that your care partner not be given the details of your procedure findings, then the procedure report has been included in a sealed envelope for you to review at your convenience later.  YOU SHOULD EXPECT: Some feelings of bloating in the abdomen. Passage of more gas than usual.  Walking can help get rid of the air that was put into your GI tract during the procedure and reduce the bloating. If you had a lower endoscopy (such as a colonoscopy or flexible sigmoidoscopy) you may notice spotting of blood in your stool or on the toilet paper. If you underwent a bowel prep for your procedure, you may not have a normal bowel movement for a few days.  Please Note:  You might notice some irritation and congestion in your nose or some drainage.  This is from the oxygen used during your procedure.  There is no need for concern and it should clear up in a day or so.  SYMPTOMS TO REPORT IMMEDIATELY:  Following lower endoscopy (colonoscopy or flexible sigmoidoscopy):  Excessive amounts of blood in the stool  Significant tenderness or worsening of abdominal pains  Swelling of the abdomen that is new, acute  Fever of 100F or higher  For urgent or emergent issues, a gastroenterologist can be reached at any hour by calling [phone]. Do not use MyChart messaging for urgent concerns.    DIET:  We do recommend a small meal at first, but then you may proceed to your regular diet.  Drink plenty of fluids but you should avoid alcoholic beverages for 24 hours.  ACTIVITY:  You should plan to take it  easy for the rest of today and you should NOT DRIVE or use heavy machinery until tomorrow (because of the sedation medicines used during the test).    FOLLOW UP: Our staff will call the number listed on your records the next business day following your procedure.  We will call around 7:15- 8:00 am to check on you and address any questions or concerns that you may have regarding the information given to you following your procedure. If we do not reach you, we will leave a message.     If any biopsies were taken you will be contacted by phone or by letter within the next 1-3 weeks.  Please call us at [phone] if you have not heard about the biopsies in 3 weeks.    SIGNATURES/CONFIDENTIALITY: You and/or your care partner have signed paperwork which will be entered into your electronic medical record.  These signatures attest to the fact that that the information above on your After Visit Summary has been reviewed and is understood.  Full responsibility of the confidentiality of this discharge information lies with you and/or your care-partner.

## 2023-05-06 NOTE — Op Note (Signed)
 [facility] Patient Name: Melanie Shaw Procedure Date: 05/06/2023 2:40 PM MRN: [id number] Endoscopist: [name] , MD, [id number] Age: 48 Referring MD:  Date of Birth: [birth year] Gender: Female Account #: [id number] Procedure:                Colonoscopy Indications:              Screening for colorectal malignant neoplasm, This                            is the patient's first colonoscopy Medicines:                Monitored Anesthesia Care Procedure:                Pre-Anesthesia Assessment:                           - Prior to the procedure, a History and Physical                            was performed, and patient medications and                            allergies were reviewed. The patient's tolerance of                            previous anesthesia was also reviewed. The risks                            and benefits of the procedure and the sedation                            options and risks were discussed with the patient.                            All questions were answered, and informed consent                            was obtained. Prior Anticoagulants: The patient has                            taken no anticoagulant or antiplatelet agents. ASA                            Grade Assessment: II - A patient with mild systemic                            disease. After reviewing the risks and benefits,                            the patient was deemed in satisfactory condition to                            undergo the procedure.  After obtaining informed consent, the colonoscope                            was passed under direct vision. Throughout the                            procedure, the patient's blood pressure, pulse, and                            oxygen saturations were monitored continuously. The                            CF [id number] #[id number] was introduced through the anus                            and advanced to the  cecum, identified by                            appendiceal orifice and ileocecal valve. The                            colonoscopy was performed without difficulty. The                            patient tolerated the procedure well. The quality                            of the bowel preparation was good. The ileocecal                            valve, appendiceal orifice, and rectum were                            photographed. Scope In: 2:48:32 PM Scope Out: 3:03:32 PM Scope Withdrawal Time: 0 hours 8 minutes 18 seconds  Total Procedure Duration: 0 hours 15 minutes 0 seconds  Findings:                 The perianal and digital rectal examinations were                            normal. Pertinent negatives include normal                            sphincter tone and no palpable rectal lesions.                           The colon (entire examined portion) appeared normal.                           The retroflexed view of the distal rectum and anal                            verge was normal and showed no anal or rectal  abnormalities. Complications:            No immediate complications. Estimated blood loss:                            None. Estimated Blood Loss:     Estimated blood loss: none. Impression:               - The entire examined colon is normal.                           - The distal rectum and anal verge are normal on                            retroflexion view.                           - No specimens collected. Recommendation:           - Discharge patient to home (ambulatory).                           - Repeat colonoscopy in 10 years for screening                            purposes.                           - The findings and recommendations were discussed                            with the designated responsible adult.                           - Return to referring physician.                           - Patient has a contact number  available for                            emergencies. The signs and symptoms of potential                            delayed complications were discussed with the                            patient. Return to normal activities tomorrow.                            Written discharge instructions were provided to the                            patient. [name], MD 05/06/2023 3:07:13 PM This report has been signed electronically.

## 2023-05-06 NOTE — Progress Notes (Signed)
 Pt's states no medical or surgical changes since previsit or office visit.

## 2023-05-06 NOTE — Progress Notes (Signed)
 Complex Care Management Note  Care Guide Note 05/06/2023 Name: Melanie Shaw MRN: [id number] DOB: Jun 09, 1975  Melanie Shaw is a 48 y.o. year old female who sees Paseda, Baird Kay, FNP for primary care. I reached out to Erasmo Score by phone today to offer complex care management services.  Ms. Kiger was given information about Complex Care Management services today including:   The Complex Care Management services include support from the care team which includes your Nurse Care Manager, Clinical Social Worker, or Pharmacist.  The Complex Care Management team is here to help remove barriers to the health concerns and goals most important to you. Complex Care Management services are voluntary, and the patient may decline or stop services at any time by request to their care team member.   Complex Care Management Consent Status: Patient did not agree to participate in complex care management services at this time.   Encounter Outcome:  Patient Refused  Burman Nieves, CMA, Care Guide [facility]  [facility], [facility] Guide Direct Dial: [phone]  Fax: [phone] Website: [email or web address]

## 2023-05-07 ENCOUNTER — Telehealth: Payer: Self-pay

## 2023-05-07 NOTE — Telephone Encounter (Signed)
 No answer after follow up call. Voice message left.

## 2023-05-15 ENCOUNTER — Telehealth: Payer: Self-pay | Admitting: Nurse Practitioner

## 2023-05-15 NOTE — Telephone Encounter (Signed)
 Copied from CRM [id number]. Topic: Clinical - Lab/Test Results >> May 15, 2023 11:58 AM Marland Kitchen D wrote: Patient wants the results of her Xrays and sleep study. She said the sleep study was done last month and she hasn't been contacted. Please contact patient. >> May 15, 2023 12:01 PM Marland Kitchen D wrote: Patient xray says not released please discuss with patient

## 2023-05-19 ENCOUNTER — Telehealth: Payer: Self-pay | Admitting: Nurse Practitioner

## 2023-05-19 NOTE — Telephone Encounter (Signed)
 Copied from CRM [id number]. Topic: General - Call Back - No Documentation >> May 18, 2023  2:48 PM Melanie Shaw wrote: Reason for CRM: Patient called about sleep study and Xray previously completed and would like a medical professional to read her results to her, by the Dr or nurse and not a 3rd party please call the patient back at [phone]

## 2023-05-21 ENCOUNTER — Telehealth: Payer: Self-pay

## 2023-05-21 NOTE — Telephone Encounter (Signed)
 Copied from CRM [id number]. Topic: General - Other >> May 21, 2023 10:27 AM Truddie Crumble wrote: Reason for CRM: patient returning a call to Zymier Rodgers at the office

## 2023-05-22 ENCOUNTER — Other Ambulatory Visit: Payer: Self-pay | Admitting: Nurse Practitioner

## 2023-05-22 DIAGNOSIS — M545 Low back pain, unspecified: Secondary | ICD-10-CM

## 2023-05-22 MED ORDER — CYCLOBENZAPRINE HCL 5 MG PO TABS
5.0000 mg | ORAL_TABLET | Freq: Three times a day (TID) | ORAL | 0 refills | Status: AC | PRN
Start: 2023-05-22 — End: ?

## 2023-05-25 NOTE — Telephone Encounter (Signed)
 Patient advised.

## 2023-05-29 NOTE — Telephone Encounter (Signed)
 Sent back to provider. Kh

## 2023-06-02 NOTE — Telephone Encounter (Signed)
 Pt was lvm and advised to call back for questions.KH

## 2023-06-09 ENCOUNTER — Encounter: Payer: Self-pay | Admitting: Nurse Practitioner

## 2023-06-09 ENCOUNTER — Ambulatory Visit (INDEPENDENT_AMBULATORY_CARE_PROVIDER_SITE_OTHER): Payer: Self-pay | Admitting: Nurse Practitioner

## 2023-06-09 VITALS — BP 185/89 | HR 68 | Temp 97.6°F | Wt 182.0 lb

## 2023-06-09 DIAGNOSIS — F4321 Adjustment disorder with depressed mood: Secondary | ICD-10-CM

## 2023-06-09 DIAGNOSIS — I10 Essential (primary) hypertension: Secondary | ICD-10-CM | POA: Diagnosis not present

## 2023-06-09 DIAGNOSIS — M545 Low back pain, unspecified: Secondary | ICD-10-CM

## 2023-06-09 DIAGNOSIS — G4700 Insomnia, unspecified: Secondary | ICD-10-CM | POA: Diagnosis not present

## 2023-06-09 NOTE — Patient Instructions (Signed)
 Please take Tylenol 650 mg every 6 hours as needed for back pain, I also encourage stretching exercises application of heat or ice.  Please consider taking your blood pressure medications as ordered to prevent heart attack, stroke, kidney damage.  Please let us  know when you would like to have the sleep study repeated, I recommended that the test should be repeated at a sleep center this time.  In the meantime I recommend taking over-the-counter melatonin 3 mg at bedtime as needed for sleep.      It is important that you exercise regularly at least 30 minutes 5 times a week as tolerated  Think about what you will eat, plan ahead. Choose " clean, green, fresh or frozen" over canned, processed or packaged foods which are more sugary, salty and fatty. 70 to 75% of food eaten should be vegetables and fruit. Three meals at set times with snacks allowed between meals, but they must be fruit or vegetables. Aim to eat over a 12 hour period , example 7 am to 7 pm, and STOP after  your last meal of the day. Drink water,generally about 64 ounces per day, no other drink is as healthy. Fruit juice is best enjoyed in a healthy way, by EATING the fruit.  Thanks for choosing [facility] we consider it a privelige to serve you.

## 2023-06-09 NOTE — Assessment & Plan Note (Signed)
 Flowsheet Row Office Visit from 06/09/2023 in [facility] - A Dept Of [facility]  PHQ-9 Total Score 27          06/09/2023    4:09 PM 05/01/2023   11:14 AM 03/10/2023    3:29 PM  GAD 7 : Generalized Anxiety Score  Nervous, Anxious, on Edge 1 0 1  Control/stop worrying 0 0 1  Worry too much - different things 0 0 1  Trouble relaxing 2 0 3  Restless 0 0 3  Easily annoyed or irritable 3 0 3  Afraid - awful might happen 3 0 2  Total GAD 7 Score 9 0 14  Anxiety Difficulty Very difficult Not difficult at all Somewhat difficult  Patient was referred for grief counseling but she had declined the services

## 2023-06-09 NOTE — Assessment & Plan Note (Signed)
 Results of x-ray reviewed with the patient Encouraged to take Tylenol 650 mg every 6 hours as needed, application of heating pad as needed encouraged   Limited evaluation due to overlapping osseous structures and overlying soft tissues. Mild facet arthropathy. There is no evidence of lumbar spine fracture. No radiographic evidence of severe osseous neural foraminal stenosis. Alignment is normal. Intervertebral disc spaces are maintained.   IMPRESSION: No acute displaced fracture or traumatic listhesis of the lumbar spine.

## 2023-06-09 NOTE — Progress Notes (Signed)
 Established Patient Office Visit  Subjective:  Patient ID: Melanie Shaw, female    DOB: 08/02/1975  Age: 48 y.o. MRN: [id number]  CC:  Chief Complaint  Patient presents with   Hypertension    HPI Kennede Lusk is a 48 y.o. female  has a past medical history of Headache, Hypertension, Numbness and tingling of right arm (12/2018), PTSD (post-traumatic stress disorder), and Vitamin D deficiency.  Patient presents for follow-up for her chronic medical conditions  Hypertension.  Blood pressure remains elevated, patient still not interested in taking blood pressure medications  Insomnia . Had a sleep study done that showed occasional apneas and hypopneas, snoring with oxygen desaturation of 88% being the lowest , mean oxygen saturation was 97.7,. there was a recommendation to manage symptoms based on clinical judgment.  Patient states that she continues to wake up gasping for air and snores.  She feels like the test was not accurate because she was not allowed to sleep like she truly sleeps during the testing due to having the equipment placed on her chest.  I recommended getting another sleep study done .we discussed referral for a sleep study at the sleep center  but she has spent so much on the home sleep study and is not ready to proceed with another test at this time   Patient was upset that the result of her DG lumbar spine and colonoscopy were not reviewed with her though  the nurse had called her earlier to review the results of the DG lumbar spine  The GI specialist had documented that they reviewed the results of the colon cancer screening with the designated responsible adult they recommended repeating colonoscopy in 10 years for screening purposes.  I did  review the results of the tests with the patient today and apologized for the delay in getting her test results reviewed.       Past Medical History:  Diagnosis Date   Headache    Hypertension    Numbness  and tingling of right arm 12/2018   PTSD (post-traumatic stress disorder)    Vitamin D deficiency     Past Surgical History:  Procedure Laterality Date   ANKLE SURGERY     CESAREAN SECTION     MULTIPLE TOOTH EXTRACTIONS      Family History  Problem Relation Age of Onset   Colon polyps Mother    Hypertension Mother    Sleep apnea Mother    Sarcoidosis Mother    Kidney disease Mother    Sleep apnea Father    Breast cancer Paternal Aunt    Stroke Neg Hx    Heart attack Neg Hx    Colon cancer Neg Hx    Esophageal cancer Neg Hx    Rectal cancer Neg Hx    Stomach cancer Neg Hx     Social History   Socioeconomic History   Marital status: Single    Spouse name: Not on file   Number of children: 1   Years of education: Not on file   Highest education level: Not on file  Occupational History   Not on file  Tobacco Use   Smoking status: Some Days    Types: Cigars   Smokeless tobacco: Never  Vaping Use   Vaping status: Never Used  Substance and Sexual Activity   Alcohol use: Yes    Comment: occasionally    Drug use: Not Currently    Types: Marijuana   Sexual activity: Not Currently  Birth control/protection: Condom  Other Topics Concern   Not on file  Social History Narrative   Lives home alone    Social Drivers of Health   Financial Resource Strain: Not on file  Food Insecurity: Not on file  Transportation Needs: Not on file  Physical Activity: Not on file  Stress: Not on file  Social Connections: Not on file  Intimate Partner Violence: Not on file    Outpatient Medications Prior to Visit  Medication Sig Dispense Refill   BIOTIN 5000 PO Take by mouth.     losartan-hydrochlorothiazide (HYZAAR) 100-25 MG tablet Take 1 tablet by mouth daily. 90 tablet 1   Multiple Vitamins-Minerals (HAIR SKIN AND NAILS FORMULA PO) Take by mouth.     amLODipine (NORVASC) 10 MG tablet Take 1 tablet (10 mg total) by mouth daily. (Patient not taking: Reported on 06/09/2023) 90  tablet 1   carbamide peroxide (DEBROX) 6.5 % OTIC solution Place 5 drops into both ears 2 (two) times daily. (Patient not taking: Reported on 06/09/2023) 15 mL 0   cyclobenzaprine (FLEXERIL) 5 MG tablet Take 1 tablet (5 mg total) by mouth 3 (three) times daily as needed. (Patient not taking: Reported on 06/09/2023) 30 tablet 0   No facility-administered medications prior to visit.    No Known Allergies  ROS Review of Systems  Constitutional:  Negative for appetite change, chills, fatigue and fever.  Respiratory:  Negative for cough, shortness of breath and wheezing.   Cardiovascular:  Negative for chest pain, palpitations and leg swelling.  Genitourinary:  Negative for difficulty urinating, dysuria, flank pain and frequency.  Musculoskeletal:  Negative for joint swelling, myalgias, neck pain and neck stiffness.  Skin:  Negative for color change, pallor, rash and wound.  Neurological:  Negative for tremors, seizures, speech difficulty and weakness.  Psychiatric/Behavioral:  Positive for sleep disturbance. Negative for behavioral problems, confusion, self-injury and suicidal ideas. The patient is nervous/anxious.       Objective:    Physical Exam Vitals and nursing note reviewed.  Constitutional:      General: She is not in acute distress.    Appearance: Normal appearance. She is not ill-appearing, toxic-appearing or diaphoretic.  Eyes:     General:        Right eye: No discharge.        Left eye: No discharge.     Extraocular Movements: Extraocular movements intact.     Conjunctiva/sclera: Conjunctivae normal.  Cardiovascular:     Rate and Rhythm: Normal rate and regular rhythm.     Pulses: Normal pulses.     Heart sounds: Normal heart sounds. No murmur heard.    No friction rub. No gallop.  Pulmonary:     Effort: Pulmonary effort is normal. No respiratory distress.     Breath sounds: Normal breath sounds. No stridor. No wheezing, rhonchi or rales.  Chest:     Chest wall: No  tenderness.  Skin:    General: Skin is warm and dry.     Capillary Refill: Capillary refill takes less than 2 seconds.     Coloration: Skin is not jaundiced or pale.     Findings: No bruising, erythema or lesion.  Neurological:     Mental Status: She is alert and oriented to person, place, and time.     Motor: No weakness.     Gait: Gait normal.  Psychiatric:        Mood and Affect: Mood normal. Affect is angry.  Thought Content: Thought content normal.        Judgment: Judgment is not impulsive or inappropriate.     BP (!) 185/89   Pulse 68   Temp 97.6 F (36.4 C)   Wt 182 lb (82.6 kg)   SpO2 100%   BMI 35.54 kg/m  Wt Readings from Last 3 Encounters:  06/09/23 182 lb (82.6 kg)  05/06/23 173 lb (78.5 kg)  05/01/23 174 lb (78.9 kg)    Lab Results  Component Value Date   TSH 1.860 03/16/2020   Lab Results  Component Value Date   WBC 7.6 03/11/2023   HGB 12.9 03/11/2023   HCT 41.2 03/11/2023   MCV 92 03/11/2023   PLT 341 03/11/2023   Lab Results  Component Value Date   NA 142 03/11/2023   K 4.5 03/11/2023   CO2 19 (L) 03/11/2023   GLUCOSE 84 03/11/2023   BUN 9 03/11/2023   CREATININE 0.97 03/11/2023   BILITOT 0.4 03/11/2023   ALKPHOS 63 03/11/2023   AST 22 03/11/2023   ALT 13 03/11/2023   PROT 6.6 03/11/2023   ALBUMIN 4.4 03/11/2023   CALCIUM 9.4 03/11/2023   ANIONGAP 9 08/20/2020   EGFR 72 03/11/2023   Lab Results  Component Value Date   CHOL 128 03/11/2023   Lab Results  Component Value Date   HDL 62 03/11/2023   Lab Results  Component Value Date   LDLCALC 47 03/11/2023   Lab Results  Component Value Date   TRIG 104 03/11/2023   Lab Results  Component Value Date   CHOLHDL 2.1 03/11/2023   Lab Results  Component Value Date   HGBA1C 5.9 (H) 03/11/2023      Assessment & Plan:   Problem List Items Addressed This Visit       Cardiovascular and Mediastinum   Hypertension   BP Readings from Last 3 Encounters:  06/09/23 (!)  185/89  05/06/23 (!) 166/88  05/01/23 (!) 184/88  Blood pressure remains elevated but the patient is not interested in taking medications.. Patient left the office in a stable condition. Will encourage the patient to consider taking medications to prevent stroke, heart attack, kidney damage        Other   Grieving - Primary   Flowsheet Row Office Visit from 06/09/2023 in [facility] - [facility] Dept Of [facility]  PHQ-9 Total Score 27          06/09/2023    4:09 PM 05/01/2023   11:14 AM 03/10/2023    3:29 PM  GAD 7 : Generalized Anxiety Score  Nervous, Anxious, on Edge 1 0 1  Control/stop worrying 0 0 1  Worry too much - different things 0 0 1  Trouble relaxing 2 0 3  Restless 0 0 3  Easily annoyed or irritable 3 0 3  Afraid - awful might happen 3 0 2  Total GAD 7 Score 9 0 14  Anxiety Difficulty Very difficult Not difficult at all Somewhat difficult  Patient was referred for grief counseling but she had declined the services        Insomnia   I recommend getting a sleep study done at the sleep center when the patient is able to get the test done to further rule out sleep apnea since since continues to have problem with insomnia, gasping for air when sleeping I recommended taking melatonin over-the-counter as needed      Acute right-sided low back pain without  sciatica   Results of x-ray reviewed with the patient Encouraged to take Tylenol 650 mg every 6 hours as needed, application of heating pad as needed encouraged   Limited evaluation due to overlapping osseous structures and overlying soft tissues. Mild facet arthropathy. There is no evidence of lumbar spine fracture. No radiographic evidence of severe osseous neural foraminal stenosis. Alignment is normal. Intervertebral disc spaces are maintained.   IMPRESSION: No acute displaced fracture or traumatic listhesis of the lumbar spine.         No orders of the defined types were  placed in this encounter.   Follow-up: No follow-ups on file.    Nkechi Linehan R Taj Arteaga, FNP

## 2023-06-09 NOTE — Assessment & Plan Note (Signed)
 I recommend getting a sleep study done at the sleep center when the patient is able to get the test done to further rule out sleep apnea since since continues to have problem with insomnia, gasping for air when sleeping I recommended taking melatonin over-the-counter as needed

## 2023-06-09 NOTE — Assessment & Plan Note (Signed)
 BP Readings from Last 3 Encounters:  06/09/23 (!) 185/89  05/06/23 (!) 166/88  05/01/23 (!) 184/88  Blood pressure remains elevated but the patient is not interested in taking medications.. Patient left the office in a stable condition. Will encourage the patient to consider taking medications to prevent stroke, heart attack, kidney damage

## 2023-06-11 ENCOUNTER — Telehealth: Payer: Self-pay

## 2023-06-11 DIAGNOSIS — G4700 Insomnia, unspecified: Secondary | ICD-10-CM

## 2023-06-11 DIAGNOSIS — I10 Essential (primary) hypertension: Secondary | ICD-10-CM

## 2023-06-11 NOTE — Telephone Encounter (Signed)
 Pulse ox ordered for pt  per Folashade Paseda. KH

## 2023-11-16 ENCOUNTER — Other Ambulatory Visit: Payer: Self-pay | Admitting: Nurse Practitioner

## 2023-11-16 DIAGNOSIS — Z1231 Encounter for screening mammogram for malignant neoplasm of breast: Secondary | ICD-10-CM

## 2023-11-20 ENCOUNTER — Ambulatory Visit
Admission: RE | Admit: 2023-11-20 | Discharge: 2023-11-20 | Disposition: A | Discharge status: Home / Self Care | Source: Ambulatory Visit | Attending: Nurse Practitioner | Admitting: Nurse Practitioner

## 2023-11-20 DIAGNOSIS — Z1231 Encounter for screening mammogram for malignant neoplasm of breast: Secondary | ICD-10-CM
